# Patient Record
Sex: Male | Born: 1975 | Race: Black or African American | Hispanic: No | Marital: Single | Smoking: Current every day smoker
Health system: Southern US, Community
[De-identification: ages and names within clinical notes are randomized; demographics above are authoritative.]

## PROBLEM LIST (undated history)

## (undated) DIAGNOSIS — A150 Tuberculosis of lung: Secondary | ICD-10-CM

---

## 2005-04-11 ENCOUNTER — Inpatient Hospital Stay (HOSPITAL_COMMUNITY): Admission: EM | Admit: 2005-04-11 | Discharge: 2005-04-11 | Payer: Self-pay | Admitting: Emergency Medicine

## 2005-04-26 ENCOUNTER — Emergency Department (HOSPITAL_COMMUNITY): Admission: EM | Admit: 2005-04-26 | Discharge: 2005-04-26 | Payer: Self-pay | Admitting: Emergency Medicine

## 2006-06-23 ENCOUNTER — Emergency Department (HOSPITAL_COMMUNITY): Admission: EM | Admit: 2006-06-23 | Discharge: 2006-06-23 | Payer: Self-pay | Admitting: Emergency Medicine

## 2006-06-25 ENCOUNTER — Emergency Department (HOSPITAL_COMMUNITY): Admission: EM | Admit: 2006-06-25 | Discharge: 2006-06-25 | Payer: Self-pay | Admitting: Emergency Medicine

## 2007-07-15 ENCOUNTER — Emergency Department (HOSPITAL_COMMUNITY): Admission: EM | Admit: 2007-07-15 | Discharge: 2007-07-15 | Payer: Self-pay | Admitting: Emergency Medicine

## 2007-07-21 ENCOUNTER — Emergency Department (HOSPITAL_COMMUNITY): Admission: EM | Admit: 2007-07-21 | Discharge: 2007-07-21 | Payer: Self-pay | Admitting: Emergency Medicine

## 2008-08-29 ENCOUNTER — Emergency Department (HOSPITAL_COMMUNITY): Admission: EM | Admit: 2008-08-29 | Discharge: 2008-08-29 | Payer: Self-pay | Admitting: Emergency Medicine

## 2008-12-26 ENCOUNTER — Ambulatory Visit: Payer: Self-pay | Admitting: Internal Medicine

## 2008-12-26 ENCOUNTER — Inpatient Hospital Stay (HOSPITAL_COMMUNITY): Admission: EM | Admit: 2008-12-26 | Discharge: 2008-12-30 | Payer: Self-pay | Admitting: Emergency Medicine

## 2008-12-27 ENCOUNTER — Ambulatory Visit: Payer: Self-pay | Admitting: Infectious Disease

## 2008-12-28 ENCOUNTER — Encounter: Payer: Self-pay | Admitting: Infectious Diseases

## 2009-01-28 ENCOUNTER — Encounter: Admission: RE | Admit: 2009-01-28 | Discharge: 2009-01-28 | Payer: Self-pay | Admitting: Infectious Diseases

## 2009-10-27 ENCOUNTER — Encounter: Admission: RE | Admit: 2009-10-27 | Discharge: 2009-10-27 | Payer: Self-pay | Admitting: Infectious Diseases

## 2010-05-14 ENCOUNTER — Encounter: Payer: Self-pay | Admitting: Infectious Diseases

## 2010-07-28 LAB — CULTURE, RESPIRATORY W GRAM STAIN: Culture: NORMAL

## 2010-07-28 LAB — PNEUMOCYSTIS JIROVECI SMEAR BY DFA: Pneumocystis jiroveci Ag: NOT DETECTED

## 2010-07-28 LAB — URINALYSIS, MICROSCOPIC ONLY
Bilirubin Urine: NEGATIVE
Glucose, UA: NEGATIVE mg/dL
Hgb urine dipstick: NEGATIVE
Ketones, ur: NEGATIVE mg/dL
Protein, ur: NEGATIVE mg/dL
Urobilinogen, UA: 2 mg/dL — ABNORMAL HIGH (ref 0.0–1.0)

## 2010-07-28 LAB — AFB CULTURE WITH SMEAR (NOT AT ARMC)

## 2010-07-28 LAB — POCT I-STAT, CHEM 8
Creatinine, Ser: 0.9 mg/dL (ref 0.4–1.5)
Glucose, Bld: 82 mg/dL (ref 70–99)
HCT: 42 % (ref 39.0–52.0)
Hemoglobin: 14.3 g/dL (ref 13.0–17.0)
Sodium: 139 mEq/L (ref 135–145)
TCO2: 29 mmol/L (ref 0–100)

## 2010-07-28 LAB — BASIC METABOLIC PANEL
CO2: 26 mEq/L (ref 19–32)
CO2: 27 mEq/L (ref 19–32)
Calcium: 8.9 mg/dL (ref 8.4–10.5)
Chloride: 102 mEq/L (ref 96–112)
GFR calc Af Amer: >60 mL/min (ref >60)
GFR calc Af Amer: >60 mL/min (ref >60)
Glucose, Bld: 89 mg/dL (ref 70–99)
Potassium: 3.8 mEq/L (ref 3.5–5.1)
Potassium: 3.9 mEq/L (ref 3.5–5.1)
Sodium: 139 mEq/L (ref 135–145)

## 2010-07-28 LAB — M. TUBERCULOSIS COMPLEX BY PCR
M. tuberculosis, Direct: DETECTED
M. tuberculosis, Direct: DETECTED

## 2010-07-28 LAB — CBC
HCT: 34.5 % — ABNORMAL LOW (ref 39.0–52.0)
HCT: 35.2 % — ABNORMAL LOW (ref 39.0–52.0)
HCT: 38.6 % — ABNORMAL LOW (ref 39.0–52.0)
HCT: 39.2 % (ref 39.0–52.0)
Hemoglobin: 11.5 g/dL — ABNORMAL LOW (ref 13.0–17.0)
Hemoglobin: 12.9 g/dL — ABNORMAL LOW (ref 13.0–17.0)
MCHC: 32.5 g/dL (ref 30.0–36.0)
MCHC: 32.8 g/dL (ref 30.0–36.0)
MCHC: 33 g/dL (ref 30.0–36.0)
MCHC: 33.3 g/dL (ref 30.0–36.0)
MCV: 77.3 fL — ABNORMAL LOW (ref 78.0–100.0)
MCV: 78 fL (ref 78.0–100.0)
MCV: 78.1 fL (ref 78.0–100.0)
Platelets: 560 10*3/uL — ABNORMAL HIGH (ref 150–400)
RBC: 4.45 MIL/uL (ref 4.22–5.81)
RBC: 4.51 MIL/uL (ref 4.22–5.81)
RBC: 5.03 MIL/uL (ref 4.22–5.81)
RDW: 14.6 % (ref 11.5–15.5)
WBC: 10 10*3/uL (ref 4.0–10.5)
WBC: 9.4 10*3/uL (ref 4.0–10.5)

## 2010-07-28 LAB — COMPREHENSIVE METABOLIC PANEL
ALT: 15 U/L (ref 0–53)
BUN: 6 mg/dL (ref 6–23)
CO2: 28 mEq/L (ref 19–32)
Calcium: 8.9 mg/dL (ref 8.4–10.5)
Creatinine, Ser: 0.97 mg/dL (ref 0.4–1.5)
GFR calc non Af Amer: >60 mL/min (ref >60)
Glucose, Bld: 73 mg/dL (ref 70–99)

## 2010-07-28 LAB — PHOSPHORUS: Phosphorus: 3.5 mg/dL (ref 2.3–4.6)

## 2010-07-28 LAB — FUNGUS CULTURE W SMEAR: Fungal Smear: NONE SEEN

## 2010-07-28 LAB — RETICULOCYTES: Retic Count, Absolute: 45.5 10*3/uL (ref 19.0–186.0)

## 2010-07-28 LAB — CULTURE, BLOOD (ROUTINE X 2): Culture: NO GROWTH

## 2010-07-28 LAB — EXPECTORATED SPUTUM ASSESSMENT W GRAM STAIN, RFLX TO RESP C

## 2010-07-28 LAB — FOLATE: Folate: 6 ng/mL

## 2010-07-28 LAB — DIFFERENTIAL
Basophils Relative: 1 % (ref 0–1)
Eosinophils Absolute: 0.2 10*3/uL (ref 0.0–0.7)
Eosinophils Relative: 2 % (ref 0–5)
Lymphs Abs: 1.5 10*3/uL (ref 0.7–4.0)
Monocytes Relative: 7 % (ref 3–12)

## 2010-07-28 LAB — IRON AND TIBC
Iron: 26 ug/dL — ABNORMAL LOW (ref 42–135)
TIBC: 191 ug/dL — ABNORMAL LOW (ref 215–435)

## 2010-07-28 LAB — C-REACTIVE PROTEIN: CRP: 5.3 mg/dL — ABNORMAL HIGH (ref <0.6)

## 2010-07-28 LAB — HIV-1 RNA QUANT-NO REFLEX-BLD
HIV 1 RNA Quant: <48 copies/mL (ref <48)
HIV-1 RNA Quant, Log: <1.68 {Log} (ref <1.68)

## 2010-07-28 LAB — HIV ANTIBODY (ROUTINE TESTING W REFLEX): HIV: NONREACTIVE

## 2010-07-28 LAB — FERRITIN: Ferritin: 278 ng/mL (ref 22–322)

## 2010-07-28 LAB — MAGNESIUM: Magnesium: 1.9 mg/dL (ref 1.5–2.5)
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
## 2011-11-26 ENCOUNTER — Emergency Department (HOSPITAL_COMMUNITY)
Admission: EM | Admit: 2011-11-26 | Discharge: 2011-11-26 | Discharge status: Against Medical Advice | Payer: Self-pay | Attending: Emergency Medicine | Admitting: Emergency Medicine

## 2011-11-26 ENCOUNTER — Encounter (HOSPITAL_COMMUNITY): Payer: Self-pay | Admitting: *Deleted

## 2011-11-26 DIAGNOSIS — Z0389 Encounter for observation for other suspected diseases and conditions ruled out: Secondary | ICD-10-CM | POA: Insufficient documentation

## 2011-11-26 NOTE — ED Notes (Signed)
Pt states last Sunday was having intercourse and felt something pop out of line in the right groin and feels knot.

## 2015-07-19 ENCOUNTER — Emergency Department (HOSPITAL_COMMUNITY)
Admission: EM | Admit: 2015-07-19 | Discharge: 2015-07-19 | Disposition: A | Discharge status: Home / Self Care | Payer: Self-pay | Attending: Emergency Medicine | Admitting: Emergency Medicine

## 2015-07-19 ENCOUNTER — Emergency Department (HOSPITAL_COMMUNITY): Payer: Self-pay

## 2015-07-19 ENCOUNTER — Encounter (HOSPITAL_COMMUNITY): Payer: Self-pay | Admitting: Emergency Medicine

## 2015-07-19 DIAGNOSIS — R079 Chest pain, unspecified: Secondary | ICD-10-CM | POA: Insufficient documentation

## 2015-07-19 DIAGNOSIS — J069 Acute upper respiratory infection, unspecified: Secondary | ICD-10-CM | POA: Insufficient documentation

## 2015-07-19 DIAGNOSIS — R110 Nausea: Secondary | ICD-10-CM | POA: Insufficient documentation

## 2015-07-19 DIAGNOSIS — F17200 Nicotine dependence, unspecified, uncomplicated: Secondary | ICD-10-CM | POA: Insufficient documentation

## 2015-07-19 MED ORDER — BENZONATATE 100 MG PO CAPS: 100.0000 mg | ORAL_CAPSULE | Freq: Three times a day (TID) | ORAL | Status: DC

## 2015-07-19 MED ORDER — BENZONATATE 100 MG PO CAPS
100.0000 mg | ORAL_CAPSULE | Freq: Once | ORAL | Status: AC
Start: 2015-07-19 — End: 2015-07-19
  Administered 2015-07-19: 100 mg via ORAL
  Filled 2015-07-19: qty 1

## 2015-07-19 NOTE — ED Notes (Signed)
Pt c/o cough and chest congestion. No pain with resting. Pt states it hurts to cough. Pt c/o drainage in his throat. Pt in NAD at this time, resp e/u. Pt taking tussin without relief.

## 2015-07-19 NOTE — ED Notes (Signed)
Pt to xray at this time.

## 2015-07-19 NOTE — ED Notes (Signed)
Pt st's he has had a cough and congestion in his chest for several days

## 2015-07-19 NOTE — Discharge Instructions (Signed)
1. Medications: tessalon for cough, usual home medications 2. Treatment: rest, drink plenty of fluids; try warm honey, tea, throat lozenges for additional symptom relief 3. Follow Up: please followup with your primary doctor for discussion of your diagnoses and further evaluation after today's visit; if you do not have a primary care doctor use the phone number listed in your discharge paperwork to find one; please return to the ER for high fever, shortness of breath, new or worsening symptoms   Upper Respiratory Infection, Adult Most upper respiratory infections (URIs) are caused by a virus. A URI affects the nose, throat, and upper air passages. The most common type of URI is often called "the common cold." HOME CARE   Take medicines only as told by your doctor.  Gargle warm saltwater or take cough drops to comfort your throat as told by your doctor.  Use a warm mist humidifier or inhale steam from a shower to increase air moisture. This may make it easier to breathe.  Drink enough fluid to keep your pee (urine) clear or pale yellow.  Eat soups and other clear broths.  Have a healthy diet.  Rest as needed.  Go back to work when your fever is gone or your doctor says it is okay.  You may need to stay home longer to avoid giving your URI to others.  You can also wear a face mask and wash your hands often to prevent spread of the virus.  Use your inhaler more if you have asthma.  Do not use any tobacco products, including cigarettes, chewing tobacco, or electronic cigarettes. If you need help quitting, ask your doctor. GET HELP IF:  You are getting worse, not better.  Your symptoms are not helped by medicine.  You have chills.  You are getting more short of breath.  You have brown or red mucus.  You have yellow or brown discharge from your nose.  You have pain in your face, especially when you bend forward.  You have a fever.  You have puffy (swollen) neck  glands.  You have pain while swallowing.  You have white areas in the back of your throat. GET HELP RIGHT AWAY IF:   You have very bad or constant:  Headache.  Ear pain.  Pain in your forehead, behind your eyes, and over your cheekbones (sinus pain).  Chest pain.  You have long-lasting (chronic) lung disease and any of the following:  Wheezing.  Long-lasting cough.  Coughing up blood.  A change in your usual mucus.  You have a stiff neck.  You have changes in your:  Vision.  Hearing.  Thinking.  Mood. MAKE SURE YOU:   Understand these instructions.  Will watch your condition.  Will get help right away if you are not doing well or get worse.   This information is not intended to replace advice given to you by your health care provider. Make sure you discuss any questions you have with your health care provider.   Document Released: 09/26/2007 Document Revised: 08/24/2014 Document Reviewed: 07/15/2013 Elsevier Interactive Patient Education [employer]2016 Elsevier Inc.

## 2015-07-19 NOTE — ED Provider Notes (Signed)
CSN: [id number]     Arrival date & time 07/19/15  1557 History   By signing my name below, I, Iona Beard, attest that this documentation has been prepared under the direction and in the presence of Mady Gemma., PA-C.  Electronically Signed: Iona Beard, ED Scribe 07/19/2015 at 5:21 PM.   Chief Complaint  Patient presents with  . Cough    The history is provided by the patient. No language interpreter was used.    HPI Comments: Steven Jacobs is a 40 y.o. male who presents to the Emergency Department complaining of gradual onset, productive cough, ongoing for four days. He reports associated chest congestion, fatigue, nasal congestion, mild nausea, and chest pain with coughing. He has taken tussin at home with minimal relief to symptoms. No other worsening or alleviating factors noted. Pt denies abdominal pain, vomiting, fever, chills, sore throat, ear pain, or any other pertinent symptoms.   History reviewed. No pertinent past medical history. History reviewed. No pertinent past surgical history. No family history on file. Social History  Substance Use Topics  . Smoking status: Current Every Day Smoker  . Smokeless tobacco: None  . Alcohol Use: Yes     Comment: occ      Review of Systems  Constitutional: Positive for fatigue. Negative for fever and chills.  HENT: Positive for congestion. Negative for ear pain and sore throat.   Respiratory: Positive for cough.   Cardiovascular: Positive for chest pain.       Chest pain with coughing.  Gastrointestinal: Positive for nausea. Negative for vomiting and abdominal pain.    Allergies  Review of patient's allergies indicates no known allergies.  Home Medications   Prior to Admission medications   Medication Sig Start Date End Date Taking? Authorizing Provider  benzonatate (TESSALON) 100 MG capsule Take 1 capsule (100 mg total) by mouth every 8 (eight) hours. 07/19/15   Dorise Hiss Westfall, PA-C    BP 120/77  mmHg  Pulse 99  Temp(Src) 99 F (37.2 C)  Resp 18  SpO2 97% Physical Exam  Constitutional: He is oriented to person, place, and time. He appears well-developed and well-nourished. No distress.  HENT:  Head: Normocephalic and atraumatic.  Right Ear: Tympanic membrane, external ear and ear canal normal.  Left Ear: Tympanic membrane, external ear and ear canal normal.  Nose: Nose normal.  Mouth/Throat: Uvula is midline, oropharynx is clear and moist and mucous membranes are normal. No oropharyngeal exudate.  Eyes: Conjunctivae, EOM and lids are normal. Pupils are equal, round, and reactive to light. Right eye exhibits no discharge. Left eye exhibits no discharge. No scleral icterus.  Neck: Normal range of motion. Neck supple.  Cardiovascular: Normal rate, regular rhythm, normal heart sounds, intact distal pulses and normal pulses.   Pulmonary/Chest: Effort normal and breath sounds normal. No respiratory distress. He has no wheezes. He has no rales.  Abdominal: Soft. Normal appearance and bowel sounds are normal. He exhibits no distension and no mass. There is no tenderness. There is no rigidity, no rebound and no guarding.  Musculoskeletal: Normal range of motion. He exhibits no edema or tenderness.  Neurological: He is alert and oriented to person, place, and time.  Skin: Skin is warm, dry and intact. No rash noted. He is not diaphoretic. No erythema. No pallor.  Psychiatric: He has a normal mood and affect. His speech is normal and behavior is normal.  Nursing note and vitals reviewed.   ED Course  Procedures (including critical care  time)  DIAGNOSTIC STUDIES: Oxygen Saturation is 97% on RA, normal by my interpretation.    COORDINATION OF CARE: 4:21 PM-Discussed treatment plan which includes CXR with pt at bedside and pt agreed to plan.   Labs Review Labs Reviewed - No data to display  Imaging Review Dg Chest 2 View  07/19/2015  CLINICAL DATA:  Cough for 4 days, smoker EXAM:  CHEST  2 VIEW COMPARISON:  10/27/2009 FINDINGS: Cardiomediastinal silhouette is stable. No infiltrate or pulmonary edema. Stable scarring in left upper lobe. Bony thorax is unremarkable. IMPRESSION: No active disease.  Stable scarring in left upper lobe. Electronically Signed   By: Natasha MeadLiviu  Pop M.D.   On: 07/19/2015 17:09   I have personally reviewed and evaluated these images as part of my medical decision-making.   EKG Interpretation None      MDM   Final diagnoses:  URI (upper respiratory infection)    40 year old male presents with productive cough since Wednesday. Notes nasal congestion and chest congestion as well as chest pain only with coughing. Patient is afebrile. Vital signs stable. TMs clear bilaterally. No edema, erythema, or exudate to posterior oropharynx. Lungs clear to auscultation bilaterally. No increased work of breathing.   Will obtain CXR and give tessalon. Imaging negative for active disease, reveals stable scarring in left upper lobe.   Discussed findings with patient, who is non-toxic and well-appearing. Feel he is stable for discharge at this time. Will give cough medicine for home. Patient to follow-up with PCP. Return precautions discussed. Patient verbalizes his understanding and is in agreement with plan.  BP 120/77 mmHg  Pulse 99  Temp(Src) 99 F (37.2 C)  Resp 18  SpO2 97%      Mady Gemmalizabeth C Westfall, PA-C 07/19/15 1723  Gerhard Munchobert Lockwood, MD 07/19/15 1931

## 2016-04-02 ENCOUNTER — Emergency Department (HOSPITAL_COMMUNITY)
Admission: EM | Admit: 2016-04-02 | Discharge: 2016-04-02 | Disposition: A | Discharge status: Home / Self Care | Payer: Self-pay | Attending: Emergency Medicine | Admitting: Emergency Medicine

## 2016-04-02 ENCOUNTER — Encounter (HOSPITAL_COMMUNITY): Payer: Self-pay

## 2016-04-02 ENCOUNTER — Emergency Department (HOSPITAL_COMMUNITY): Payer: Self-pay

## 2016-04-02 DIAGNOSIS — Z87891 Personal history of nicotine dependence: Secondary | ICD-10-CM | POA: Insufficient documentation

## 2016-04-02 DIAGNOSIS — R21 Rash and other nonspecific skin eruption: Secondary | ICD-10-CM | POA: Insufficient documentation

## 2016-04-02 DIAGNOSIS — R05 Cough: Secondary | ICD-10-CM | POA: Insufficient documentation

## 2016-04-02 DIAGNOSIS — R059 Cough, unspecified: Secondary | ICD-10-CM

## 2016-04-02 HISTORY — DX: Tuberculosis of lung: A15.0

## 2016-04-02 MED ORDER — IPRATROPIUM-ALBUTEROL 0.5-2.5 (3) MG/3ML IN SOLN
3.0000 mL | Freq: Once | RESPIRATORY_TRACT | Status: AC
  Administered 2016-04-02: 3 mL via RESPIRATORY_TRACT
  Filled 2016-04-02: qty 3

## 2016-04-02 MED ORDER — AZITHROMYCIN 250 MG PO TABS: 250.0000 mg | ORAL_TABLET | Freq: Every day | ORAL | 0 refills | Status: DC

## 2016-04-02 MED ORDER — BENZONATATE 100 MG PO CAPS: 100.0000 mg | ORAL_CAPSULE | Freq: Three times a day (TID) | ORAL | 0 refills | Status: DC

## 2016-04-02 NOTE — ED Triage Notes (Signed)
Per Pt, Pt is coming from home with complaints of continued Cough and sputum. Pt has been taken over the counter medications with no relief. Pt denies fevers and chills at this time. Pt noted a rash from the medication on his left clavicle.

## 2016-04-02 NOTE — Discharge Instructions (Signed)
Take antibiotic for 5 days. Take with food Take cough medicine as needed Use hydrocortisone 1% over the counter for rash and itching. You can also take Benadryl for itching which is over the counter Drink plenty of fluids Return if symptoms are worsening Avoid smoking as much as possible

## 2016-04-02 NOTE — ED Provider Notes (Signed)
[facility]-EMERGENCY DEPT Provider Note   CSN: [id number] Arrival date & time: 04/02/16  1101  By signing my name below, I, Gasper Sellsobert Ryan MilfordHalas, attest that this documentation has been prepared under the direction and in the presence of Terance HartKelly Jose Corvin, PA-C. Electronically Signed: Javier Dockerobert Ryan Halas, ER Scribe. 12/03/2015. 1:07 PM.  History   Chief Complaint Chief Complaint  Patient presents with  . Rash  . Cough   HPI HPI Comments: Steven Jacobs is a 40 y.o. male who presents to the Emergency Department complaining of 12 days of productive cough. He is taking an OTC antitussive cough syrup. He endorses mild rhinorrhea. He had fever, chills and diaphoresis at onset but those sx have since resolved. He denies sore throat. 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 He is a regular every day smoker, 1 PPD.   He is also complaining of two days of mild rash on his upper chest and neck. It is mildly itchy. He has been using a new detergent. He denies trouble breathing.   Past Medical History:  Diagnosis Date  . TB (pulmonary tuberculosis)     There are no active problems to display for this patient.   History reviewed. No pertinent surgical history.     Home Medications    Prior to Admission medications   Medication Sig Start Date End Date Taking? Authorizing Provider  benzonatate (TESSALON) 100 MG capsule Take 1 capsule (100 mg total) by mouth every 8 (eight) hours. 07/19/15   Mady GemmaElizabeth C Westfall, PA-C    Family History No family history on file.  Social History Social History  Substance Use Topics  . Smoking status: Former Games developermoker  . Smokeless tobacco: Never Used  . Alcohol use Yes     Comment: occ     Allergies   Patient has no known allergies.   Review of Systems Review of Systems  Constitutional: Negative for chills, diaphoresis and fever.  HENT: Positive for congestion and rhinorrhea. Negative for ear pain, postnasal drip, sinus pain  and sore throat.   Respiratory: Positive for cough. Negative for shortness of breath and wheezing.   Cardiovascular: Negative for chest pain.  Skin: Positive for rash.  All other systems reviewed and are negative.    Physical Exam Updated Vital Signs BP 127/83 (BP Location: Left Arm)   Pulse 81   Temp 97.8 F (36.6 C) (Oral)   Resp 18   Ht 5\' 11"  (1.803 m)   Wt 167 lb (75.8 kg)   SpO2 99%   BMI 23.29 kg/m   Physical Exam  Constitutional: He is oriented to person, place, and time. He appears well-developed and well-nourished. No distress.  HENT:  Head: Normocephalic and atraumatic.  Right Ear: Hearing, tympanic membrane, external ear and ear canal normal.  Left Ear: Hearing, tympanic membrane, external ear and ear canal normal.  Nose: Mucosal edema present. No rhinorrhea.  Mouth/Throat: Uvula is midline, oropharynx is clear and moist and mucous membranes are normal.  Eyes: Pupils are equal, round, and reactive to light.  Neck: Neck supple.  Cardiovascular: Normal rate and regular rhythm.  Exam reveals no gallop and no friction rub.   No murmur heard. Pulmonary/Chest: Effort normal. No respiratory distress. He has wheezes. He has no rales. He exhibits no tenderness.  Mild expiratory wheezes  Musculoskeletal: Normal range of motion.  Neurological: He is alert and oriented to person, place, and time. Coordination normal.  Skin: Skin is warm and dry. He is  not diaphoretic.  Psychiatric: He has a normal mood and affect. His behavior is normal.  Nursing note and vitals reviewed.    ED Treatments / Results  Labs (all labs ordered are listed, but only abnormal results are displayed) Labs Reviewed - No data to display  EKG  EKG Interpretation None       Radiology Dg Chest 2 View  Result Date: 04/02/2016 CLINICAL DATA:  Cough for 2 weeks. EXAM: CHEST  2 VIEW COMPARISON:  07/19/2015 FINDINGS: Stable left upper lobe scarring. Lungs otherwise clear. No pleural effusion.  No pneumothorax. Cardiac silhouette is normal in size. No mediastinal or hilar masses. No evidence of adenopathy. Skeletal structures are intact. IMPRESSION: No active cardiopulmonary disease. Electronically Signed   By: Amie Portlandavid  Ormond M.D.   On: 04/02/2016 12:49     Procedures Procedures (including critical care time)  Medications Ordered in ED Medications  ipratropium-albuterol (DUONEB) 0.5-2.5 (3) MG/3ML nebulizer solution 3 mL (3 mLs Nebulization Given 04/02/16 1321)     Initial Impression / Assessment and Plan / ED Course  I have reviewed the triage vital signs and the nursing notes.  Pertinent labs & imaging results that were available during my care of the patient were reviewed by me and considered in my medical decision making (see chart for details).  Clinical Course    40 year old male presents with productive cough and wheezing. Patient is afebrile, not tachycardic or tachypneic, normotensive, and not hypoxic. CXR remarkable for old upper lobe scarring. Lung exam remarkable for wheezing - Duoneb ordered. Will d/c with course of Azithromycin to cover for CAP. Patient is NAD, non-toxic, with stable VS. Patient is informed of clinical course, understands medical decision making process, and agrees with plan. Opportunity for questions provided and all questions answered. Return precautions given.   Final Clinical Impressions(s) / ED Diagnoses   Final diagnoses:  Cough  Rash    New Prescriptions Discharge Medication List as of 04/02/2016  1:42 PM    START taking these medications   Details  azithromycin (ZITHROMAX) 250 MG tablet Take 1 tablet (250 mg total) by mouth daily. Take first 2 tablets together, then 1 every day until finished., Starting Mon 04/02/2016, Print        I personally performed the services described in this documentation, which was scribed in my presence. The recorded information has been reviewed and is accurate.        Bethel BornKelly Marie Dayon Witt,  PA-C 04/07/16 [phone]    Loren Raceravid Yelverton, MD 04/07/16 [phone]

## 2016-04-02 NOTE — ED Notes (Signed)
Declined W/C at D/C and was escorted to lobby by RN. 

## 2016-10-22 IMAGING — DX DG CHEST 2V
2 series · 2 of 2 positions shown · non-contrast
Comparison: 10/27/2009

CLINICAL DATA: Cough for 4 days, smoker

EXAM:
CHEST  2 VIEW

[chest pa]
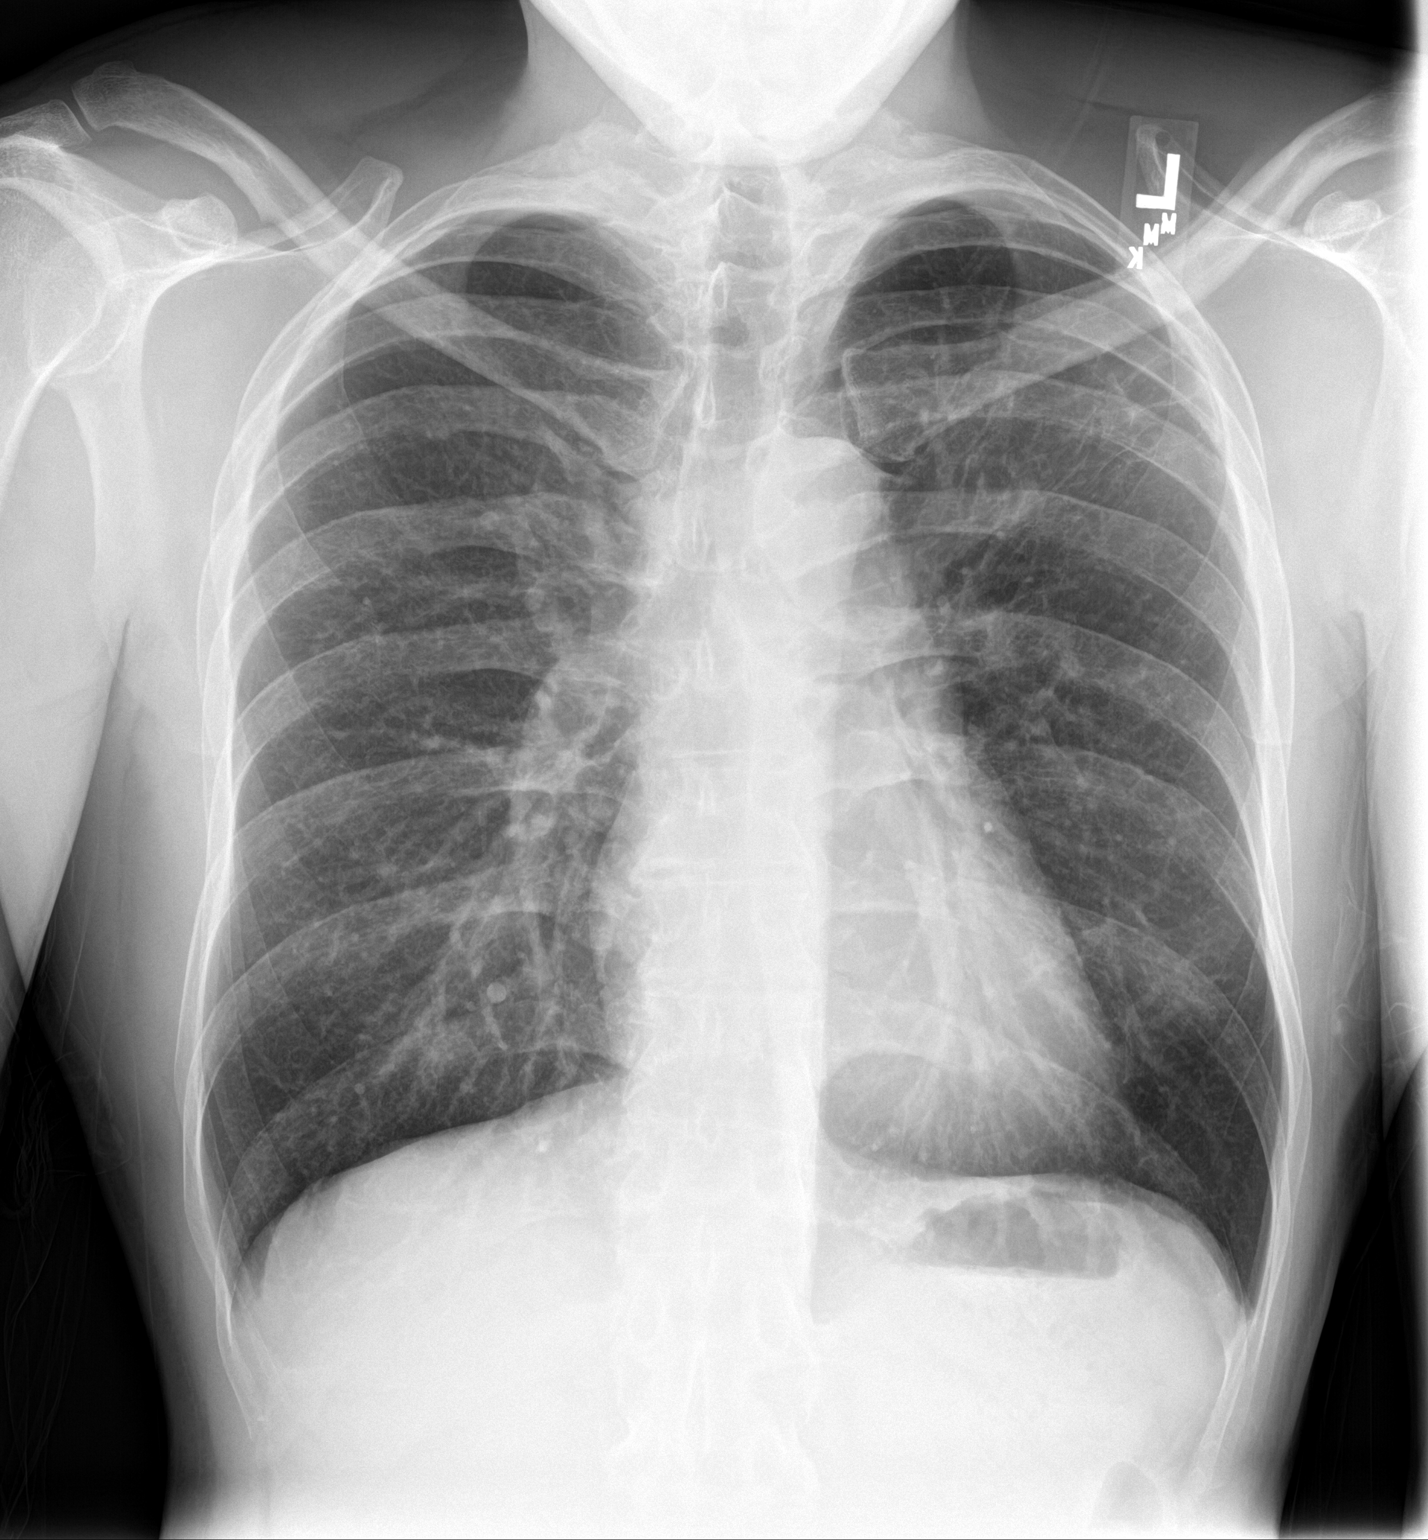

[chest lat]
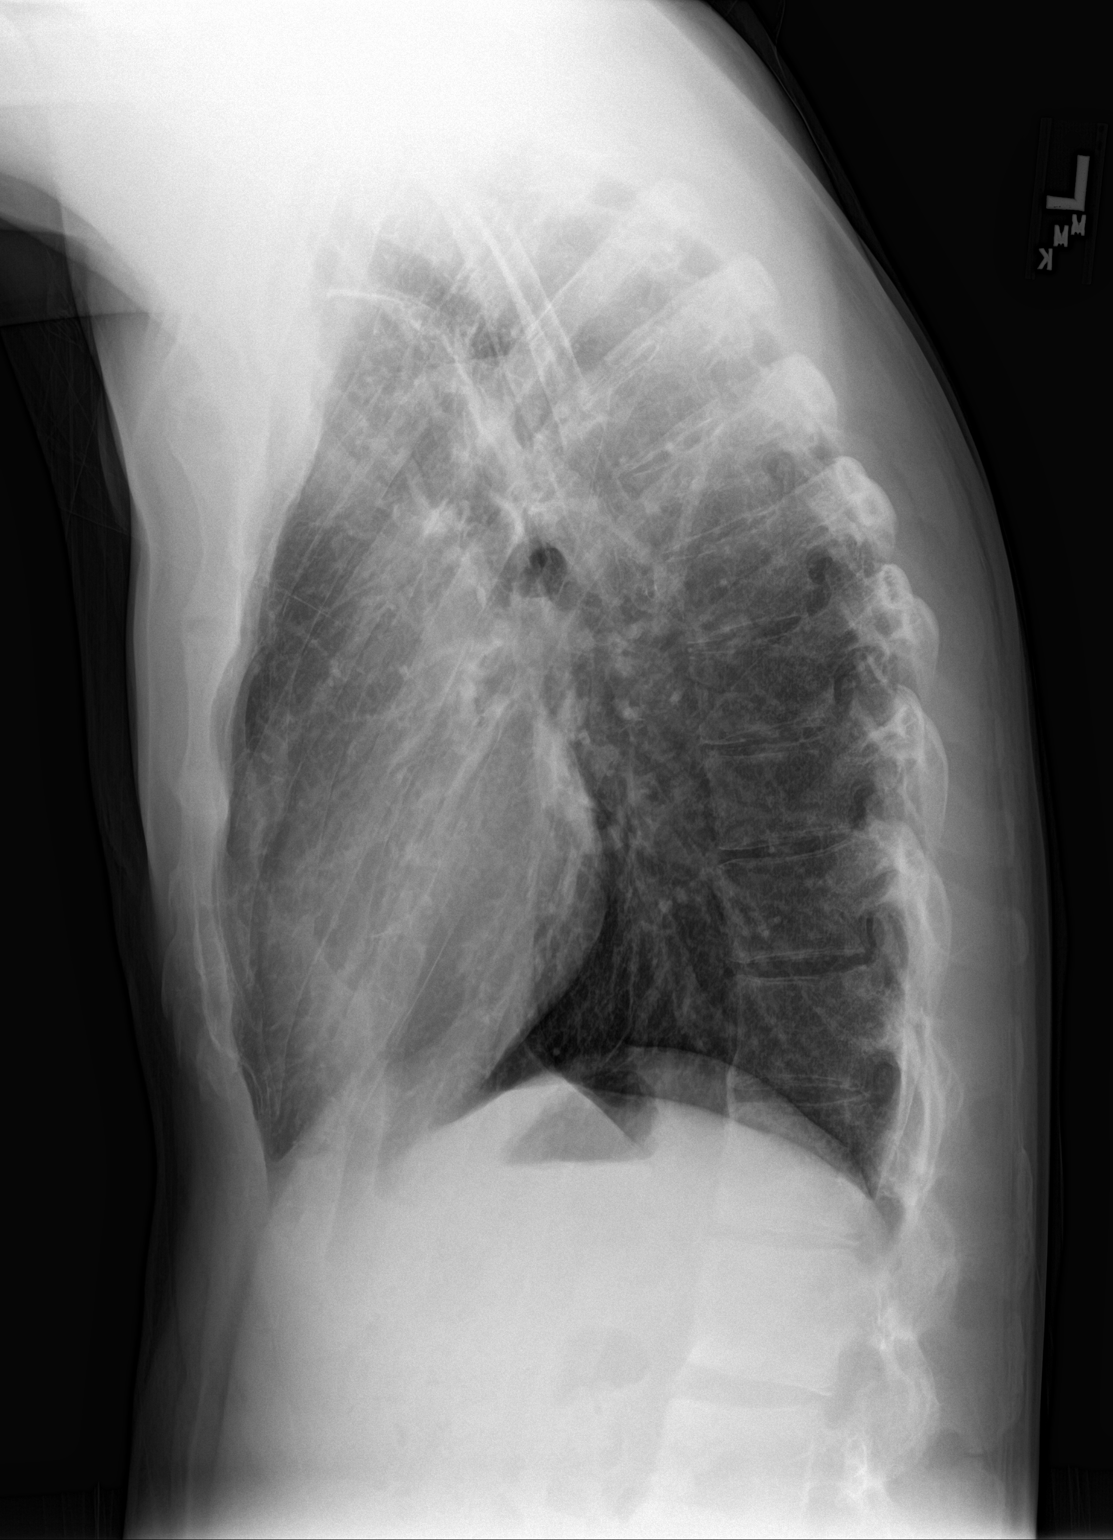

[2 of 2 positions shown; findings below may reference images not displayed]

FINDINGS: Cardiomediastinal silhouette is stable. No infiltrate or pulmonary
edema. Stable scarring in left upper lobe. Bony thorax is
unremarkable.
IMPRESSION: No active disease.  Stable scarring in left upper lobe.

## 2018-01-28 ENCOUNTER — Encounter (HOSPITAL_COMMUNITY): Payer: Self-pay

## 2018-01-28 ENCOUNTER — Other Ambulatory Visit: Payer: Self-pay

## 2018-01-28 ENCOUNTER — Emergency Department (HOSPITAL_COMMUNITY)
Admission: EM | Admit: 2018-01-28 | Discharge: 2018-01-28 | Disposition: A | Discharge status: Home / Self Care | Payer: Self-pay | Attending: Emergency Medicine | Admitting: Emergency Medicine

## 2018-01-28 DIAGNOSIS — K047 Periapical abscess without sinus: Secondary | ICD-10-CM | POA: Insufficient documentation

## 2018-01-28 DIAGNOSIS — F17200 Nicotine dependence, unspecified, uncomplicated: Secondary | ICD-10-CM | POA: Insufficient documentation

## 2018-01-28 DIAGNOSIS — K0889 Other specified disorders of teeth and supporting structures: Secondary | ICD-10-CM

## 2018-01-28 MED ORDER — BUTAMBEN-TETRACAINE-BENZOCAINE 2-2-14 % EX AERO
1.0000 | INHALATION_SPRAY | Freq: Once | CUTANEOUS | Status: AC
  Administered 2018-01-28: 1 via TOPICAL
  Filled 2018-01-28: qty 20

## 2018-01-28 MED ORDER — IBUPROFEN 600 MG PO TABS: 600.0000 mg | ORAL_TABLET | Freq: Four times a day (QID) | ORAL | 0 refills | Status: DC | PRN

## 2018-01-28 MED ORDER — CLINDAMYCIN HCL 150 MG PO CAPS: 300.0000 mg | ORAL_CAPSULE | Freq: Three times a day (TID) | ORAL | 0 refills | Status: AC

## 2018-01-28 MED ORDER — BUPIVACAINE-EPINEPHRINE (PF) 0.5% -1:200000 IJ SOLN
1.8000 mL | Freq: Once | INTRAMUSCULAR | Status: AC
  Administered 2018-01-28: 1.8 mL
  Filled 2018-01-28: qty 1.8

## 2018-01-28 MED ORDER — LIDOCAINE VISCOUS HCL 2 % MT SOLN: 15.0000 mL | OROMUCOSAL | 0 refills | Status: DC | PRN

## 2018-01-28 NOTE — ED Triage Notes (Signed)
Pt c/o dental pain and oral swelling on left side X2 days.

## 2018-01-28 NOTE — ED Notes (Signed)
Pt stable, ambulatory, and verbalizes understanding of d/c instructions. Security returned pt's knife from security office.

## 2018-01-28 NOTE — ED Provider Notes (Signed)
[facility] EMERGENCY DEPARTMENT Provider Note   CSN: [id number] Arrival date & time: 01/28/18  0740     History   Chief Complaint Chief Complaint  Patient presents with  . Dental Pain    HPI Steven Jacobs is a 42 y.o. male who presents emergency department today for dental pain x2 days.  Patient reports that on Sunday he began noticing pain in his left lower molars.  He reports the pain is throbbing in nature and rates his moderate in severity.  He notes nothing makes his symptoms better or worse.  He has not tried anything for symptoms.  He does not have a dentist. Denies fever, chills, voice change, inability to control secretions, nausea/vomiting, facial swelling, neck swelling, dysphagia, odynophagia, drainage or trauma  HPI  Past Medical History:  Diagnosis Date  . TB (pulmonary tuberculosis)     There are no active problems to display for this patient.   History reviewed. No pertinent surgical history.      Home Medications    Prior to Admission medications   Medication Sig Start Date End Date Taking? Authorizing Provider  azithromycin (ZITHROMAX) 250 MG tablet Take 1 tablet (250 mg total) by mouth daily. Take first 2 tablets together, then 1 every day until finished. 04/02/16   Bethel Born, PA-C  benzonatate (TESSALON) 100 MG capsule Take 1 capsule (100 mg total) by mouth every 8 (eight) hours. 04/02/16   Bethel Born, PA-C    Family History No family history on file.  Social History Social History   Tobacco Use  . Smoking status: Current Every Day Smoker    Packs/day: 1.00  . Smokeless tobacco: Never Used  Substance Use Topics  . Alcohol use: Yes    Comment: occ  . Drug use: Yes    Types: Marijuana     Allergies   Patient has no known allergies.   Review of Systems Review of Systems  Constitutional: Negative for chills and fever.  HENT: Positive for dental problem. Negative for drooling, ear pain, sinus pain,  sore throat, trouble swallowing and voice change.   Gastrointestinal: Negative for nausea and vomiting.  Musculoskeletal: Negative for neck stiffness.  Neurological: Negative for headaches.     Physical Exam Updated Vital Signs BP (!) 125/95 (BP Location: Right Arm)   Pulse 99   Temp 99.3 F (37.4 C) (Oral)   Resp 18   Ht 5\' 10"  (1.778 m)   Wt 71.2 kg   SpO2 98%   BMI 22.53 kg/m   Physical Exam  Constitutional: He appears well-developed and well-nourished.  HENT:  Head: Normocephalic and atraumatic.  Right Ear: External ear normal.  Left Ear: External ear normal.  Nose: Nose normal. Right sinus exhibits no maxillary sinus tenderness and no frontal sinus tenderness. Left sinus exhibits no maxillary sinus tenderness and no frontal sinus tenderness.  Mouth/Throat:    The patient has normal phonation and is in control of secretions. No stridor.  Midline uvula without edema. Soft palate rises symmetrically.  No tonsillar erythema or exudates. No PTA. Tongue protrusion is normal. No trismus. No creptius on neck palpation. No neck swelling. Patient has overall poor dentition. Pain  On palpation of teeth indicated in diagram. Patient has fluctuance along gumline of corresponding teeth. No floor edema.No drainage. Mucus membranes moist.   Eyes: Conjunctivae are normal. Right eye exhibits no discharge. Left eye exhibits no discharge. No scleral icterus.  Neck:  No nuchal rigidity or meningismus. No crepitus  on neck palpation.  No submental or submandibular edema  Pulmonary/Chest: Effort normal. No respiratory distress.  Lymphadenopathy:       Head (right side): No submental and no submandibular adenopathy present.       Head (left side): No submental and no submandibular adenopathy present.    He has no cervical adenopathy.  Neurological: He is alert.  Skin: No pallor.  Psychiatric: He has a normal mood and affect.  Nursing note and vitals reviewed.    ED Treatments / Results    Labs (all labs ordered are listed, but only abnormal results are displayed) Labs Reviewed - No data to display  EKG None  Radiology No results found.  Procedures .Marland KitchenIncision and Drainage Date/Time: 01/28/2018 9:14 AM Performed by: Jacinto Halim, PA-C Authorized by: Jacinto Halim, PA-C   Consent:    Consent obtained:  Verbal   Consent given by:  Patient   Risks discussed:  Bleeding, damage to other organs, infection, incomplete drainage and pain   Alternatives discussed:  No treatment Location:    Type:  Abscess (dental)   Size:  1cm   Location: dental. Anesthesia (see MAR for exact dosages):    Anesthesia method:  Topical application   Topical anesthetic:  Benzocaine gel (cetcan spray) Procedure type:    Complexity:  Simple Procedure details:    Needle aspiration: yes     Needle size:  18 G   Drainage:  Purulent and bloody   Drainage amount:  Moderate   Wound treatment:  Wound left open   Packing materials:  None Post-procedure details:    Patient tolerance of procedure:  Tolerated well, no immediate complications   (including critical care time)   Medications Ordered in ED Medications  butamben-tetracaine-benzocaine (CETACAINE) spray 1 spray (has no administration in time range)  bupivacaine-epinephrine (MARCAINE W/ EPI) 0.5% -1:200000 injection 1.8 mL (1.8 mLs Infiltration Given by Other 01/28/18 0804)     Initial Impression / Assessment and Plan / ED Course  I have reviewed the triage vital signs and the nursing notes.  Pertinent labs & imaging results that were available during my care of the patient were reviewed by me and considered in my medical decision making (see chart for details).     42 y.o. male with dental pain with gingival abscess that was drained in the department. Exam unconcerning for Ludwig's angina.  Will treat with clindamycin and anti-inflammatories medicine.  Urged patient to follow-up with dentist. Dental resources given.  Return precautions discussed.  Final Clinical Impressions(s) / ED Diagnoses   Final diagnoses:  Pain, dental    ED Discharge Orders         Ordered    clindamycin (CLEOCIN) 150 MG capsule  3 times daily     01/28/18 0914    ibuprofen (ADVIL,MOTRIN) 600 MG tablet  Every 6 hours PRN     01/28/18 0914    lidocaine (XYLOCAINE) 2 % solution  As needed     01/28/18 0914           Jacinto Halim, PA-C 01/28/18 0915    Eber Hong, MD 01/29/18 [phone]

## 2018-01-28 NOTE — ED Notes (Signed)
Dental box placed at the bedside

## 2018-01-28 NOTE — ED Provider Notes (Signed)
Medical screening examination/treatment/procedure(s) were conducted as a shared visit with non-physician practitioner(s) and myself.  I personally evaluated the patient during the encounter.  Clinical Impression:   Final diagnoses:  Pain, dental   42 year old male with progressive swelling of the left lower jaw over the last several days, generally poor dentition, on exam he does have some tenderness to the left lower gingiva with swelling but no trismus torticollis or lymphadenopathy of the neck of any significant degree.  No change in phonation.  Likely has early phlegmon or abscess, antibiotics indicated, attempt needle drainage, patient otherwise stable for discharge, does not have any signs of lipids.   Eber Hong, MD 01/29/18 [phone]

## 2018-01-28 NOTE — Discharge Instructions (Signed)
Please read and follow all provided instructions.  Your diagnoses today include:  1. Pain, dental     The exam and treatment you received today has been provided on an emergency basis only. This is not a substitute for complete medical or dental care. This problem will not resolve on its own without the care of a dentist.  Tests performed today include: Vital signs. See below for your results today.   Medications prescribed:   Take any prescribed medications only as directed. Use ibuprofen or naproxen for pain. Use the viscous lidocaine for mouth pain. Swish with the lidocaine and spit it out. Do not swallow it.  Please take all of your antibiotics until finished!   You may develop abdominal discomfort or diarrhea from the antibiotic.  You may help offset this with probiotics which you can buy or get in yogurt. Do not eat or take the probiotics until 2 hours after your antibiotic. Do not take your medicine if develop an itchy rash, swelling in your mouth or lips, or difficulty breathing.   Home care instructions:  Follow any educational materials contained in this packet.  Follow-up instructions: Please follow-up with your dentist for further evaluation of your symptoms.   Dental Assistance: See handout for dental referrals  Return instructions:  Please return to the Emergency Department if you experience worsening symptoms. Please return if you develop a fever, you develop more swelling in your face or neck, you have trouble breathing or swallowing food. Please return if you have any other emergent concerns.  Additional Information:  Your vital signs today were: BP (!) 125/95 (BP Location: Right Arm)    Pulse 99    Temp 99.3 F (37.4 C) (Oral)    Resp 18    Ht 5\' 10"  (1.778 m)    Wt 71.2 kg    SpO2 98%    BMI 22.53 kg/m  If your blood pressure (BP) was elevated above 135/85 this visit, please have this repeated by your doctor within one month. --------------

## 2020-03-11 ENCOUNTER — Emergency Department (HOSPITAL_COMMUNITY): Payer: Self-pay

## 2020-03-11 ENCOUNTER — Encounter (HOSPITAL_COMMUNITY): Payer: Self-pay | Admitting: Emergency Medicine

## 2020-03-11 ENCOUNTER — Emergency Department (HOSPITAL_COMMUNITY)
Admission: EM | Admit: 2020-03-11 | Discharge: 2020-03-12 | Disposition: A | Discharge status: Home / Self Care | Payer: Self-pay | Attending: Emergency Medicine | Admitting: Emergency Medicine

## 2020-03-11 ENCOUNTER — Other Ambulatory Visit: Payer: Self-pay

## 2020-03-11 DIAGNOSIS — F17200 Nicotine dependence, unspecified, uncomplicated: Secondary | ICD-10-CM | POA: Insufficient documentation

## 2020-03-11 DIAGNOSIS — R0602 Shortness of breath: Secondary | ICD-10-CM | POA: Insufficient documentation

## 2020-03-11 DIAGNOSIS — J069 Acute upper respiratory infection, unspecified: Secondary | ICD-10-CM | POA: Insufficient documentation

## 2020-03-11 DIAGNOSIS — Z20822 Contact with and (suspected) exposure to covid-19: Secondary | ICD-10-CM | POA: Insufficient documentation

## 2020-03-11 DIAGNOSIS — M791 Myalgia, unspecified site: Secondary | ICD-10-CM | POA: Insufficient documentation

## 2020-03-11 DIAGNOSIS — F1590 Other stimulant use, unspecified, uncomplicated: Secondary | ICD-10-CM | POA: Insufficient documentation

## 2020-03-11 LAB — COMPREHENSIVE METABOLIC PANEL
ALT: 15 U/L (ref 0–44)
AST: 17 U/L (ref 15–41)
Albumin: 3.9 g/dL (ref 3.5–5.0)
Alkaline Phosphatase: 108 U/L (ref 38–126)
Anion gap: 10 (ref 5–15)
BUN: 7 mg/dL (ref 6–20)
CO2: 23 mmol/L (ref 22–32)
Calcium: 8.9 mg/dL (ref 8.9–10.3)
Chloride: 104 mmol/L (ref 98–111)
Creatinine, Ser: 1.05 mg/dL (ref 0.61–1.24)
GFR, Estimated: >60 mL/min (ref >60)
Glucose, Bld: 101 mg/dL — ABNORMAL HIGH (ref 70–99)
Potassium: 3.4 mmol/L — ABNORMAL LOW (ref 3.5–5.1)
Sodium: 137 mmol/L (ref 135–145)
Total Bilirubin: 0.7 mg/dL (ref 0.3–1.2)
Total Protein: 8.9 g/dL — ABNORMAL HIGH (ref 6.5–8.1)

## 2020-03-11 LAB — CBC WITH DIFFERENTIAL/PLATELET
Abs Immature Granulocytes: 0.05 10*3/uL (ref 0.00–0.07)
Basophils Absolute: 0.1 10*3/uL (ref 0.0–0.1)
Basophils Relative: 1 %
Eosinophils Absolute: 0.1 10*3/uL (ref 0.0–0.5)
Eosinophils Relative: 1 %
HCT: 40.7 % (ref 39.0–52.0)
Hemoglobin: 13.6 g/dL (ref 13.0–17.0)
Immature Granulocytes: 0 %
Lymphocytes Relative: 15 %
Lymphs Abs: 2 10*3/uL (ref 0.7–4.0)
MCH: 25.3 pg — ABNORMAL LOW (ref 26.0–34.0)
MCHC: 33.4 g/dL (ref 30.0–36.0)
MCV: 75.8 fL — ABNORMAL LOW (ref 80.0–100.0)
Monocytes Absolute: 1.5 10*3/uL — ABNORMAL HIGH (ref 0.1–1.0)
Monocytes Relative: 12 %
Neutro Abs: 9.4 10*3/uL — ABNORMAL HIGH (ref 1.7–7.7)
Neutrophils Relative %: 71 %
Platelets: 411 10*3/uL — ABNORMAL HIGH (ref 150–400)
RBC: 5.37 MIL/uL (ref 4.22–5.81)
RDW: 13.9 % (ref 11.5–15.5)
WBC: 13.1 10*3/uL — ABNORMAL HIGH (ref 4.0–10.5)
nRBC: 0 % (ref 0.0–0.2)

## 2020-03-11 LAB — URINALYSIS, ROUTINE W REFLEX MICROSCOPIC
Bacteria, UA: NONE SEEN
Bilirubin Urine: NEGATIVE
Glucose, UA: NEGATIVE mg/dL
Hgb urine dipstick: NEGATIVE
Ketones, ur: NEGATIVE mg/dL
Leukocytes,Ua: NEGATIVE
Nitrite: NEGATIVE
Protein, ur: 30 mg/dL — AB
Specific Gravity, Urine: 1.006 (ref 1.005–1.030)
pH: 6 (ref 5.0–8.0)

## 2020-03-11 LAB — LACTIC ACID, PLASMA: Lactic Acid, Venous: 1.1 mmol/L (ref 0.5–1.9)

## 2020-03-11 LAB — RESPIRATORY PANEL BY RT PCR (FLU A&B, COVID)
Influenza A by PCR: NEGATIVE
Influenza B by PCR: NEGATIVE
SARS Coronavirus 2 by RT PCR: NEGATIVE

## 2020-03-11 MED ORDER — OXYCODONE-ACETAMINOPHEN 5-325 MG PO TABS
1.0000 | ORAL_TABLET | ORAL | Status: DC | PRN
  Administered 2020-03-11: 1 via ORAL
  Filled 2020-03-11: qty 1

## 2020-03-11 MED ORDER — ALBUTEROL SULFATE HFA 108 (90 BASE) MCG/ACT IN AERS
1.0000 | INHALATION_SPRAY | Freq: Once | RESPIRATORY_TRACT | Status: AC
  Administered 2020-03-11: 2 via RESPIRATORY_TRACT
  Filled 2020-03-11: qty 6.7

## 2020-03-11 NOTE — ED Notes (Signed)
Pt c/o worsening SOB, and CP. Pt audibly wheezing. Repeat EKG and given inhaler

## 2020-03-11 NOTE — ED Triage Notes (Addendum)
Exposed to covid a few days ago-- now has body aches, cough -- occasionally productive for white sputum-- has had a fever at home. Taking Mucinex OTC   Is unvaccinated

## 2020-03-12 MED ORDER — PREDNISONE 20 MG PO TABS: ORAL_TABLET | ORAL | 0 refills | Status: DC

## 2020-03-12 MED ORDER — AZITHROMYCIN 250 MG PO TABS
500.0000 mg | ORAL_TABLET | Freq: Once | ORAL | Status: AC
  Administered 2020-03-12: 500 mg via ORAL
  Filled 2020-03-12: qty 2

## 2020-03-12 MED ORDER — PREDNISONE 20 MG PO TABS
60.0000 mg | ORAL_TABLET | Freq: Once | ORAL | Status: AC
Start: 2020-03-12 — End: 2020-03-12
  Administered 2020-03-12: 60 mg via ORAL
  Filled 2020-03-12: qty 3

## 2020-03-12 MED ORDER — BENZONATATE 100 MG PO CAPS: 100.0000 mg | ORAL_CAPSULE | Freq: Three times a day (TID) | ORAL | 0 refills | Status: DC

## 2020-03-12 MED ORDER — AZITHROMYCIN 250 MG PO TABS: 250.0000 mg | ORAL_TABLET | Freq: Every day | ORAL | 0 refills | Status: DC

## 2020-03-12 NOTE — ED Notes (Signed)
Pt reports large relief of SOB w/ inhaler. Wheezing not heard audibly at this point
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
## 2020-11-18 ENCOUNTER — Emergency Department (HOSPITAL_COMMUNITY): Payer: Self-pay

## 2020-11-18 ENCOUNTER — Other Ambulatory Visit: Payer: Self-pay

## 2020-11-18 ENCOUNTER — Emergency Department (HOSPITAL_COMMUNITY)
Admission: EM | Admit: 2020-11-18 | Discharge: 2020-11-19 | Disposition: A | Discharge status: Home / Self Care | Payer: Self-pay | Attending: Emergency Medicine | Admitting: Emergency Medicine

## 2020-11-18 ENCOUNTER — Encounter (HOSPITAL_COMMUNITY): Payer: Self-pay

## 2020-11-18 DIAGNOSIS — K5900 Constipation, unspecified: Secondary | ICD-10-CM | POA: Insufficient documentation

## 2020-11-18 DIAGNOSIS — F17210 Nicotine dependence, cigarettes, uncomplicated: Secondary | ICD-10-CM | POA: Insufficient documentation

## 2020-11-18 DIAGNOSIS — K529 Noninfective gastroenteritis and colitis, unspecified: Secondary | ICD-10-CM | POA: Insufficient documentation

## 2020-11-18 LAB — COMPREHENSIVE METABOLIC PANEL
ALT: 17 U/L (ref 0–44)
AST: 18 U/L (ref 15–41)
Albumin: 3.3 g/dL — ABNORMAL LOW (ref 3.5–5.0)
Alkaline Phosphatase: 65 U/L (ref 38–126)
Anion gap: 10 (ref 5–15)
BUN: 11 mg/dL (ref 6–20)
CO2: 25 mmol/L (ref 22–32)
Calcium: 8.6 mg/dL — ABNORMAL LOW (ref 8.9–10.3)
Chloride: 103 mmol/L (ref 98–111)
Creatinine, Ser: 1.09 mg/dL (ref 0.61–1.24)
GFR, Estimated: >60 mL/min (ref >60)
Glucose, Bld: 82 mg/dL (ref 70–99)
Potassium: 3.7 mmol/L (ref 3.5–5.1)
Sodium: 138 mmol/L (ref 135–145)
Total Bilirubin: 0.8 mg/dL (ref 0.3–1.2)
Total Protein: 7.9 g/dL (ref 6.5–8.1)

## 2020-11-18 LAB — URINALYSIS, ROUTINE W REFLEX MICROSCOPIC
Bacteria, UA: NONE SEEN
Bilirubin Urine: NEGATIVE
Glucose, UA: NEGATIVE mg/dL
Hgb urine dipstick: NEGATIVE
Ketones, ur: 5 mg/dL — AB
Leukocytes,Ua: NEGATIVE
Nitrite: NEGATIVE
Protein, ur: 30 mg/dL — AB
Specific Gravity, Urine: 1.021 (ref 1.005–1.030)
pH: 5 (ref 5.0–8.0)

## 2020-11-18 LAB — CBC WITH DIFFERENTIAL/PLATELET
Abs Immature Granulocytes: 0.05 10*3/uL (ref 0.00–0.07)
Basophils Absolute: 0.1 10*3/uL (ref 0.0–0.1)
Basophils Relative: 1 %
Eosinophils Absolute: 0.2 10*3/uL (ref 0.0–0.5)
Eosinophils Relative: 3 %
HCT: 35.1 % — ABNORMAL LOW (ref 39.0–52.0)
Hemoglobin: 11.7 g/dL — ABNORMAL LOW (ref 13.0–17.0)
Immature Granulocytes: 1 %
Lymphocytes Relative: 27 %
Lymphs Abs: 2 10*3/uL (ref 0.7–4.0)
MCH: 25.5 pg — ABNORMAL LOW (ref 26.0–34.0)
MCHC: 33.3 g/dL (ref 30.0–36.0)
MCV: 76.5 fL — ABNORMAL LOW (ref 80.0–100.0)
Monocytes Absolute: 0.9 10*3/uL (ref 0.1–1.0)
Monocytes Relative: 12 %
Neutro Abs: 4.2 10*3/uL (ref 1.7–7.7)
Neutrophils Relative %: 56 %
Platelets: 546 10*3/uL — ABNORMAL HIGH (ref 150–400)
RBC: 4.59 MIL/uL (ref 4.22–5.81)
RDW: 13.2 % (ref 11.5–15.5)
WBC: 7.3 10*3/uL (ref 4.0–10.5)
nRBC: 0 % (ref 0.0–0.2)

## 2020-11-18 LAB — LIPASE, BLOOD: Lipase: 35 U/L (ref 11–51)

## 2020-11-18 MED ORDER — LACTATED RINGERS IV BOLUS
1000.0000 mL | Freq: Once | INTRAVENOUS | Status: AC
  Administered 2020-11-18: 1000 mL via INTRAVENOUS

## 2020-11-18 MED ORDER — IOHEXOL 300 MG/ML  SOLN
100.0000 mL | Freq: Once | INTRAMUSCULAR | Status: AC | PRN
  Administered 2020-11-18: 100 mL via INTRAVENOUS

## 2020-11-18 NOTE — Discharge Instructions (Addendum)
Today your CAT scan showed you had inflammation of your bowels which seems to slowly be getting better.  Gradually go back to your normal diet but initially avoid fried foods or fatty foods.  Try apple sauce, oatmeal and toast.  You can have vegetables and fruits and lean meats.  If you start to have worsening pain in the next 1-2 days or get a fever or vomiting you need to come back to the ER.

## 2020-11-18 NOTE — ED Provider Notes (Signed)
[facility]ENCY [facility] Provider Note   CSN: [id number] Arrival date & time: 11/18/20  1255     History Chief Complaint  Patient presents with   Abdominal Pain   Constipation    Steven PentaJames C Biehl is a 45 y.o. male.  Patient is a 45 year old male with a history of prior TB and chronic right hip injury who walks with a cane presenting today with complaints of abdominal pain.  Patient reports symptoms of been present for approximately 10 days.  He started when he was having intercourse and was about to climax and felt like when he attempted to ejaculate it went the opposite way.  He had a severe pain in his stomach which he reported went all the way up into his chest.  Since that time he has had abdominal pain and urinary issues.  It was worse 10 days ago and has gradually started getting better.  He reports for 5 days he did not have a bowel movement and was straining to go to the bathroom at all.  He has been eating very light foods and clear liquids but is finally started having regular bowel movements again.  Initially he was straining to urinate with a weak stream and felt like he was having to strain to get the urine out but that has been gradually improving as well.  He has not noticed any blood in his urine or stool.  He has not had any pain to his penis, deformity and has been able to achieve an erection since the event.  He has no testicular pain or swelling.  He denies any nausea or vomiting.  No fevers.  No prior abdominal surgeries.  He now is having pain in his lower abdomen which is better than it was but still present.  He denies any chest pain or shortness of breath.  The history is provided by the patient.  Abdominal Pain Associated symptoms: constipation   Constipation Associated symptoms: abdominal pain       Past Medical History:  Diagnosis Date   TB (pulmonary tuberculosis)     There are no problems to display for this patient.   History  reviewed. No pertinent surgical history.     No family history on file.  Social History   Tobacco Use   Smoking status: Every Day    Packs/day: 1.00    Types: Cigarettes   Smokeless tobacco: Never  Substance Use Topics   Alcohol use: Yes    Comment: occ   Drug use: Yes    Types: Marijuana    Home Medications Prior to Admission medications   Medication Sig Start Date End Date Taking? Authorizing Provider  azithromycin (ZITHROMAX) 250 MG tablet Take 1 tablet (250 mg total) by mouth daily. Take first 2 tablets together, then 1 every day until finished. 03/12/20   Mesner, Barbara CowerJason, MD  benzonatate (TESSALON) 100 MG capsule Take 1 capsule (100 mg total) by mouth every 8 (eight) hours. 03/12/20   Mesner, Barbara CowerJason, MD  ibuprofen (ADVIL,MOTRIN) 600 MG tablet Take 1 tablet (600 mg total) by mouth every 6 (six) hours as needed. 01/28/18   Maczis, Elmer SowMichael M, PA-C  lidocaine (XYLOCAINE) 2 % solution Use as directed 15 mLs in the mouth or throat as needed for mouth pain. 01/28/18   Maczis, Elmer SowMichael M, PA-C  predniSONE (DELTASONE) 20 MG tablet 3 tabs po day one, then 2 po daily x 4 days 03/12/20   Mesner, Barbara CowerJason, MD    Allergies  Patient has no known allergies.  Review of Systems   Review of Systems  Gastrointestinal:  Positive for abdominal pain and constipation.  All other systems reviewed and are negative.  Physical Exam Updated Vital Signs BP 129/89   Pulse 92   Temp 98 F (36.7 C) (Oral)   Resp 16   SpO2 100%   Physical Exam Vitals and nursing note reviewed.  Constitutional:      General: He is not in acute distress.    Appearance: Normal appearance. He is well-developed and normal weight.  HENT:     Head: Normocephalic and atraumatic.     Mouth/Throat:     Mouth: Mucous membranes are moist.  Eyes:     Conjunctiva/sclera: Conjunctivae normal.     Pupils: Pupils are equal, round, and reactive to light.  Cardiovascular:     Rate and Rhythm: Normal rate and regular rhythm.      Pulses: Normal pulses.     Heart sounds: No murmur heard. Pulmonary:     Effort: Pulmonary effort is normal. No respiratory distress.     Breath sounds: Normal breath sounds. No wheezing or rales.  Abdominal:     General: Abdomen is flat. Bowel sounds are normal. There is no distension.     Palpations: Abdomen is soft.     Tenderness: There is abdominal tenderness in the suprapubic area and left lower quadrant. There is no right CVA tenderness, left CVA tenderness, guarding or rebound.     Hernia: No hernia is present. There is no hernia in the left inguinal area or right inguinal area.  Genitourinary:    Penis: Normal.      Testes: Normal.     Comments: No evidence of penile deformity, swelling or tenderness to the shaft.  No lesions present. Musculoskeletal:        General: No tenderness. Normal range of motion.     Cervical back: Normal range of motion and neck supple.     Right lower leg: No edema.     Left lower leg: No edema.  Skin:    General: Skin is warm and dry.     Findings: No erythema or rash.  Neurological:     Mental Status: He is alert and oriented to person, place, and time. Mental status is at baseline.  Psychiatric:        Mood and Affect: Mood normal.        Behavior: Behavior normal.    ED Results / Procedures / Treatments   Labs (all labs ordered are listed, but only abnormal results are displayed) Labs Reviewed  CBC WITH DIFFERENTIAL/PLATELET - Abnormal; Notable for the following components:      Result Value   Hemoglobin 11.7 (*)    HCT 35.1 (*)    MCV 76.5 (*)    MCH 25.5 (*)    Platelets 546 (*)    All other components within normal limits  COMPREHENSIVE METABOLIC PANEL - Abnormal; Notable for the following components:   Calcium 8.6 (*)    Albumin 3.3 (*)    All other components within normal limits  URINALYSIS, ROUTINE W REFLEX MICROSCOPIC - Abnormal; Notable for the following components:   APPearance HAZY (*)    Ketones, ur 5 (*)     Protein, ur 30 (*)    All other components within normal limits  LIPASE, BLOOD    EKG None  Radiology CT ABDOMEN PELVIS W CONTRAST  Result Date: 11/18/2020 CLINICAL DATA:  Diverticulitis suspected abdominal  pain EXAM: CT ABDOMEN AND PELVIS WITH CONTRAST TECHNIQUE: Multidetector CT imaging of the abdomen and pelvis was performed using the standard protocol following bolus administration of intravenous contrast. CONTRAST:  OMNIPAQUE IOHEXOL 300 MG/ML  SOLN COMPARISON:  None. FINDINGS: Lower chest: Lung bases are clear. No effusions. Heart is normal size. Hepatobiliary: No focal hepatic abnormality. Gallbladder unremarkable. Pancreas: No focal abnormality or ductal dilatation. Spleen: No focal abnormality.  Normal size. Adrenals/Urinary Tract: No adrenal abnormality. No focal renal abnormality. No stones or hydronephrosis. Urinary bladder is unremarkable. Stomach/Bowel: Within the right lower quadrant, there are prominent distal ileal small bowel loops. In addition, the appendix appears to pass into this area of prominent small bowel with there appears to be inflammation. Appendix is mildly prominent measuring up to 9 mm in diameter. This could reflect appendicitis with secondary inflammation of the adjacent distal ileum, for distal ileal enteritis with secondary inflammation of the appendix. Large bowel is decompressed and unremarkable. Stomach unremarkable. There are mildly prominent right lower quadrant mesenteric lymph nodes. Vascular/Lymphatic: Mildly prominent right lower quadrant mesenteric lymph nodes. No aneurysm. Reproductive: No visible focal abnormality. Other: No free fluid or free air. Musculoskeletal: No acute bony abnormality. There appears to be ankylosis of the SI joints and across the lumbar spine disc spaces suggesting ankylosing spondylitis. IMPRESSION: Prominent, inflamed appearing small bowel loops in the distal ileal region. Appendix is also in this area and appears prominent.  It is difficult to determine if this represents appendicitis with adjacent reactive enteritis, or infectious/inflammatory enteritis with reactive inflammation of the appendix. Mildly prominent right lower quadrant mesenteric lymph nodes. Electronically Signed   By: Charlett Nose M.D.   On: 11/18/2020 22:55    Procedures Procedures   Medications Ordered in ED Medications - No data to display  ED Course  I have reviewed the triage vital signs and the nursing notes.  Pertinent labs & imaging results that were available during my care of the patient were reviewed by me and considered in my medical decision making (see chart for details).    MDM Rules/Calculators/A&P                           Patient is a 45 year old male presenting with vague abdominal pain for 10 days which he reports occurred while having sex 10 days ago.  He was complaining of decrease in his stream of urine and straining to get the urine out but that is gradually improving.  UA today without evidence of infection and no exam findings concerning for a fractured penis or acute testicular cause.  There are no exam findings consistent with hernia.  However given patient's abdominal pain worse with eating and constipation concern for possible diverticulitis.  UA is within normal limits, CBC without acute findings, CMP and lipase are normal.  CT to further evaluate is pending.  Patient's vital signs are within normal limits.  11:03 PM CT shows prominent inflamed appearing small bowel loops in the distal ileum region.  Appendix is also in this area and appears prominent.  Difficult to determine if this is appendicitis versus reactive enteritis.  Has been more diffuse lower abdominal pain not specific to the right lower quadrant.  There is mildly prominent right lower quadrant mesenteric lymph nodes.  Will discuss with general surgery.  11:19 PM Spoke with Dr. Derrell Lolling with general surgery who also reviewed the films and at this time  low suspicion for appendicitis given pt's sx have  been ongoing now for 10 days at least.  Benign abd exam and on repeat exam pt has NO RLQ pain.  Suspect improving enteritis.  Pt given return precautions if he develops worsening pain, fever or vomiting.  Will have gradually reintroduce food and pt was d/ced home.  MDM   Amount and/or Complexity of Data Reviewed Clinical lab tests: ordered and reviewed Tests in the radiology section of CPT: ordered and reviewed Discuss the patient with other providers: yes Independent visualization of images, tracings, or specimens: yes     Final Clinical Impression(s) / ED Diagnoses Final diagnoses:  Enteritis    Rx / DC Orders ED Discharge Orders     None        Gwyneth Sprout, MD 11/18/20 2324

## 2020-11-18 NOTE — ED Triage Notes (Signed)
Pt reports abd pain for the past 2 weeks after having intercourse. Pt states "I was trying to hold in my orgasm and I felt like it went inside my stomach and not out" Pt also report constipation for the past few days but did have a BM this morning.

## 2020-11-18 NOTE — ED Provider Notes (Signed)
Emergency Medicine Provider Triage Evaluation Note  Steven Jacobs , a 45 y.o. male  was evaluated in triage.  Pt complains of lower abd pain that has been present for 2 weeks.  Review of Systems  Positive: Abd pain Negative: fever  Physical Exam  BP 130/75 (BP Location: Right Arm)   Pulse (!) 123   Temp 98.4 F (36.9 C) (Oral)   Resp 16   SpO2 97%  Gen:   Awake, no distress   Resp:  Normal effort  MSK:   Moves extremities without difficulty  Other:    Medical Decision Making  Medically screening exam initiated at 1:41 PM.  Appropriate orders placed.  [name] was informed that the remainder of the evaluation will be completed by another provider, this initial triage assessment does not replace that evaluation, and the importance of remaining in the ED until their evaluation is complete.     Karrie Meres, PA-C 11/18/20 1342    Cathren Laine, MD 11/19/20 1245

## 2020-11-18 NOTE — ED Notes (Signed)
Steven Jacobs [phone] would like an update

## 2021-01-05 ENCOUNTER — Other Ambulatory Visit: Payer: Self-pay

## 2021-01-05 ENCOUNTER — Encounter: Payer: Self-pay | Admitting: Podiatrist

## 2021-01-05 ENCOUNTER — Ambulatory Visit (INDEPENDENT_AMBULATORY_CARE_PROVIDER_SITE_OTHER): Payer: Self-pay | Admitting: Podiatrist

## 2021-01-05 DIAGNOSIS — L600 Ingrowing nail: Secondary | ICD-10-CM

## 2021-01-05 DIAGNOSIS — L608 Other nail disorders: Secondary | ICD-10-CM

## 2021-01-05 NOTE — Patient Instructions (Signed)

## 2021-01-05 NOTE — Progress Notes (Signed)
Chief Complaint  Patient presents with   Nail Problem    Bilateral 1st toenails lateral borders painful and tip of nail painful.     HPI: Patient is 45 y.o. male who presents today for pain on the corners of both great toenails.  He states that the nails grow in to the skin and cause pain especially if he accidentally hits the toes.  He relates he is unable to trim the nails due to the pain.  He also has a problem with his hip and is unable to trim his nails himself.  There are no problems to display for this patient.   Current Outpatient Medications on File Prior to Visit  Medication Sig Dispense Refill   azithromycin (ZITHROMAX) 250 MG tablet Take 1 tablet (250 mg total) by mouth daily. Take first 2 tablets together, then 1 every day until finished. (Patient not taking: Reported on 01/05/2021) 6 tablet 0   benzonatate (TESSALON) 100 MG capsule Take 1 capsule (100 mg total) by mouth every 8 (eight) hours. (Patient not taking: Reported on 01/05/2021) 21 capsule 0   ibuprofen (ADVIL,MOTRIN) 600 MG tablet Take 1 tablet (600 mg total) by mouth every 6 (six) hours as needed. (Patient not taking: Reported on 01/05/2021) 30 tablet 0   lidocaine (XYLOCAINE) 2 % solution Use as directed 15 mLs in the mouth or throat as needed for mouth pain. (Patient not taking: Reported on 01/05/2021) 100 mL 0   predniSONE (DELTASONE) 20 MG tablet 3 tabs po day one, then 2 po daily x 4 days (Patient not taking: Reported on 01/05/2021) 11 tablet 0   No current facility-administered medications on file prior to visit.    No Known Allergies  Review of Systems No fevers, chills, nausea, muscle aches, no difficulty breathing, no calf pain, no chest pain or shortness of breath.   Physical Exam  GENERAL APPEARANCE: Alert, conversant. Appropriately groomed. No acute distress.   VASCULAR: Pedal pulses palpable DP and PT bilateral.  Capillary refill time is immediate to all digits,  Proximal to distal cooling it warm to  warm.  Digital perfusion adequate.   NEUROLOGIC: sensation is intact to 5.07 monofilament at 5/5 sites bilateral.  Light touch is intact bilateral, vibratory sensation intact bilateral  MUSCULOSKELETAL: acceptable muscle strength, tone and stability bilateral.  No gross boney pedal deformities noted.  No pain, crepitus or limitation noted with foot and ankle range of motion bilateral.   DERMATOLOGIC: skin is warm, supple, and dry.  No open lesions noted.  No rash, no pre ulcerative lesions.  Both hallux nails have a pincer nail type deformity and they are ingrowing on the medial and lateral nail borders.  Pain is noted with direct pressure of great toenails bilateral.  Right great toenail does appear to have subungual debris present and likely is fungal in nature.    Assessment   1. Ingrown toenail of both feet   2. Pincer nail deformity      Plan  Discussed treatment options and alternatives with the patient.  Discussed nail trim versus excision of the offending nail borders and the patient would like the toes fixed.  I did agree I prepped the skin with alcohol and infiltrated lidocaine and Marcaine mixture in a digital block fashion on bilateral hallux.  The toes were then prepped with Betadine and exsanguinated and the medial and lateral nail borders were then sharply removed from distal to proximal.  The matrix tissue was exposed and phenol was applied.  Alcohol  wash was then performed and Silvadene cream and a dry sterile compressive dressing was applied to bilateral hallux.  She was given instruction for soaks and aftercare and was instructed to soak and keep the toes covered with antibiotic ointment and a Band-Aid for 2 weeks's post procedure.  If anytime he notices any redness, swelling, drainage, pain or sign of infection he is instructed to call to be seen.

## 2021-06-15 IMAGING — DX DG CHEST 1V PORT
1 series · 1 of 1 positions shown · non-contrast
Comparison: March 29, 2016

CLINICAL DATA: COVID symptoms.

EXAM:
PORTABLE CHEST 1 VIEW

[chest ap]
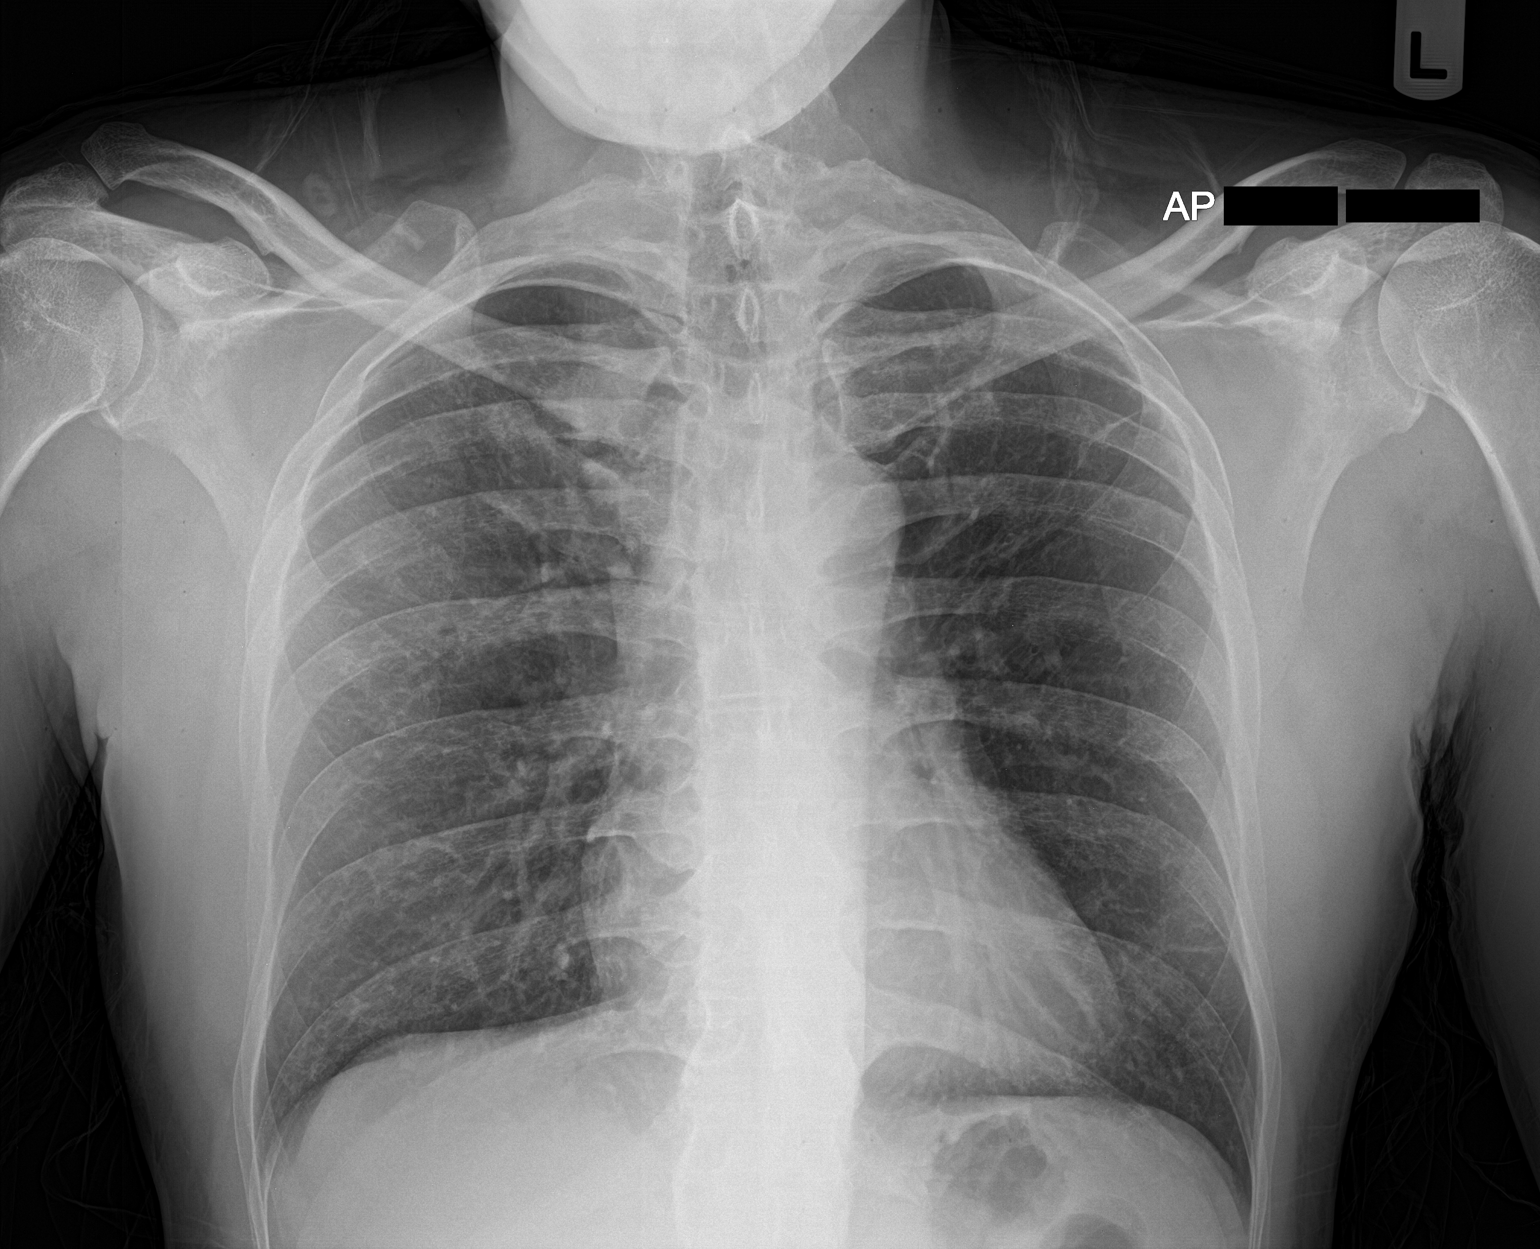

[1 of 1 positions shown; findings below may reference images not displayed]

FINDINGS: The heart, hila, and mediastinum are unremarkable. No pneumothorax.
The left lung is clear. Mild haziness over the right chest could be
due to patient rotation. No focal infiltrates. No other acute
abnormalities.
IMPRESSION: No definite focal infiltrate. Mild haziness over the right chest
could be due to mild patient rotation. No other acute abnormalities.

## 2022-02-22 IMAGING — CT CT ABD-PELV W/ CM
2 of 4 series · 15 of 46 positions shown, 17 images · IV contrast (APPLIED)
Comparison: None.

CLINICAL DATA: Diverticulitis suspected abdominal pain

EXAM:
CT ABDOMEN AND PELVIS WITH CONTRAST
TECHNIQUE: Multidetector CT imaging of the abdomen and pelvis was performed
using the standard protocol following bolus administration of
intravenous contrast.
CONTRAST:  100mL OMNIPAQUE IOHEXOL 300 MG/ML  SOLN

[Series 3: abdomen 5.0 · axial · 0.88mm/px · z∈[+761,+1156]mm · 12 of 89 slices shown, 14 images]
[im 5/89  soft-tissue]
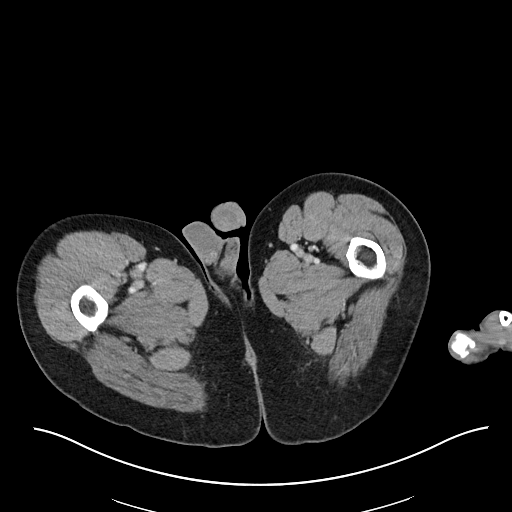
[im 5/89  bone]
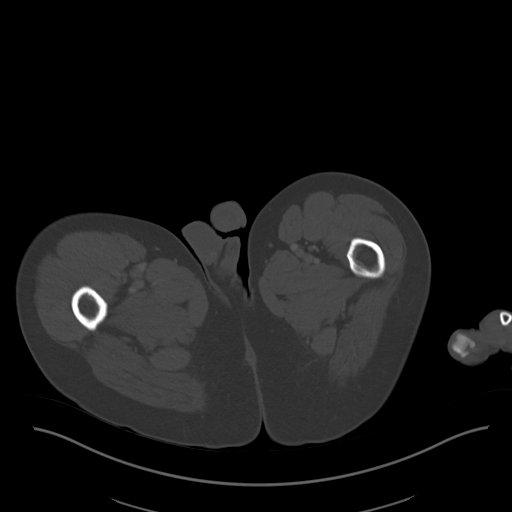
[im 14/89  soft-tissue]
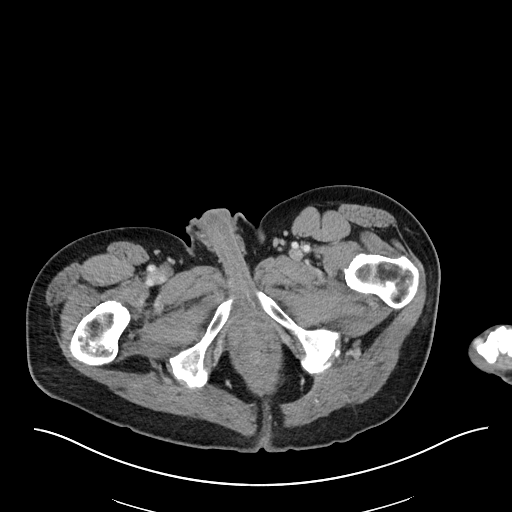
[im 19/89  soft-tissue]
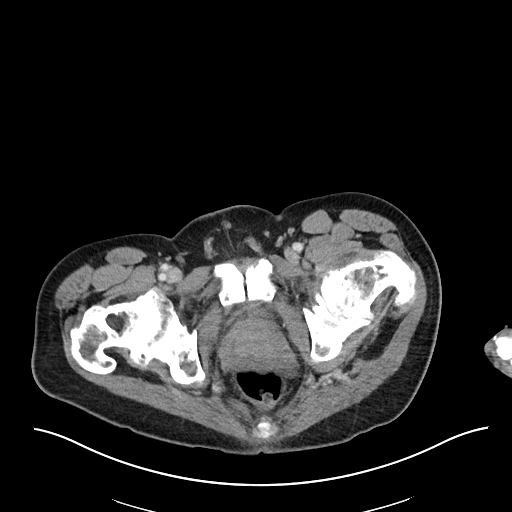
[im 28/89  soft-tissue]
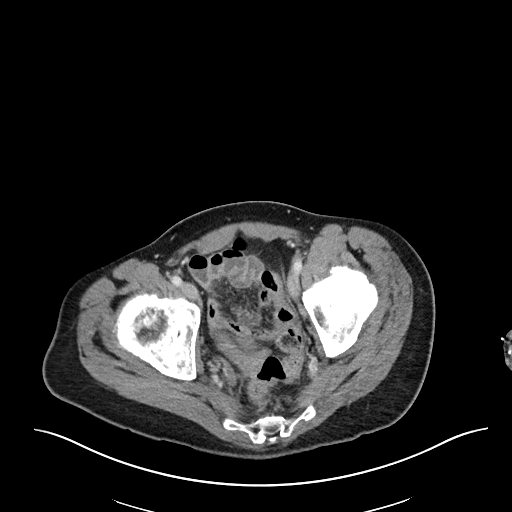
[im 33/89  soft-tissue]
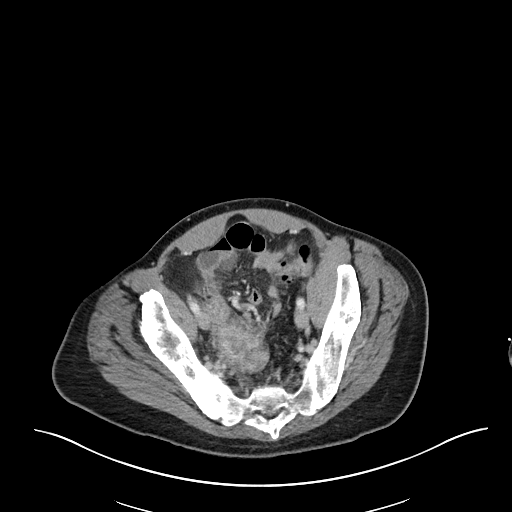
[im 42/89  soft-tissue]
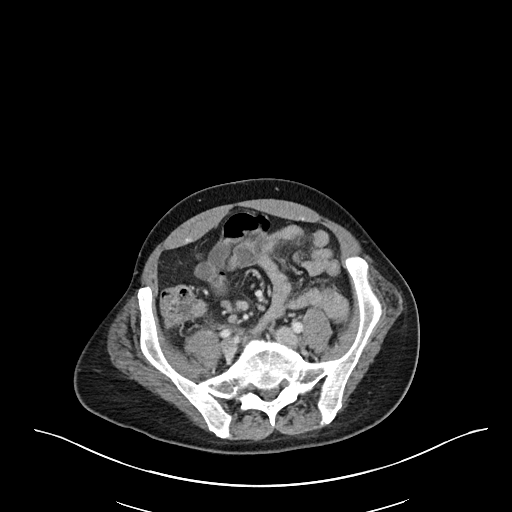
[im 47/89  soft-tissue]
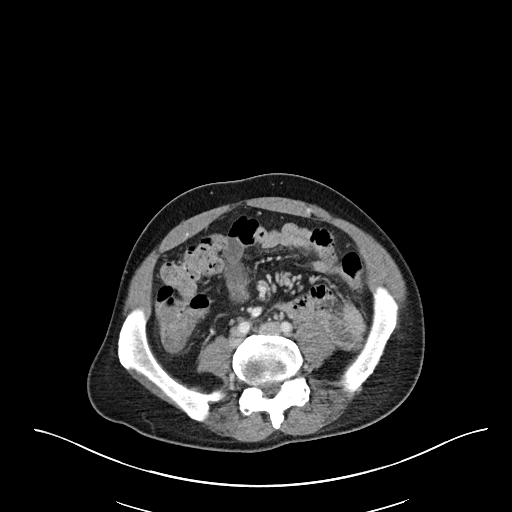
[im 56/89  soft-tissue]
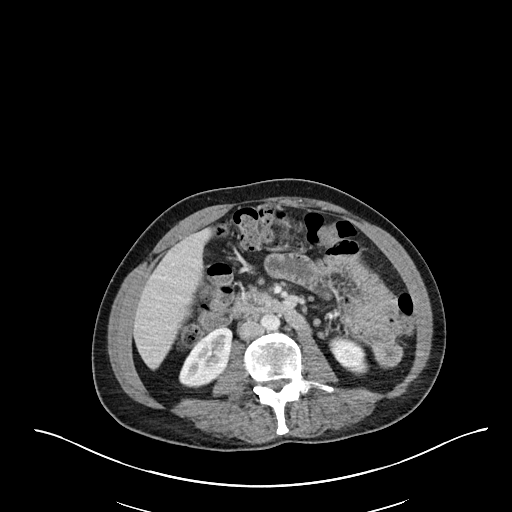
[im 61/89  soft-tissue]
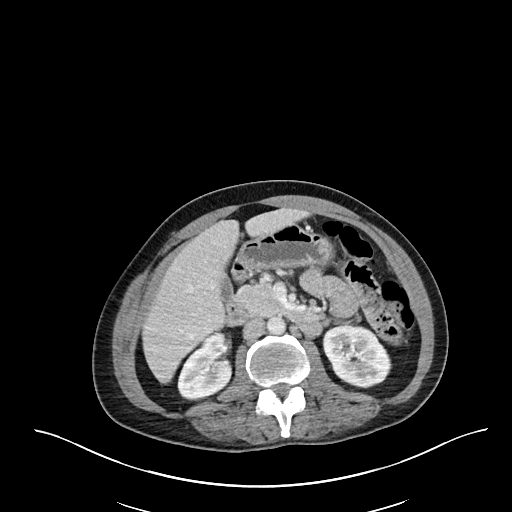
[im 61/89  bone]
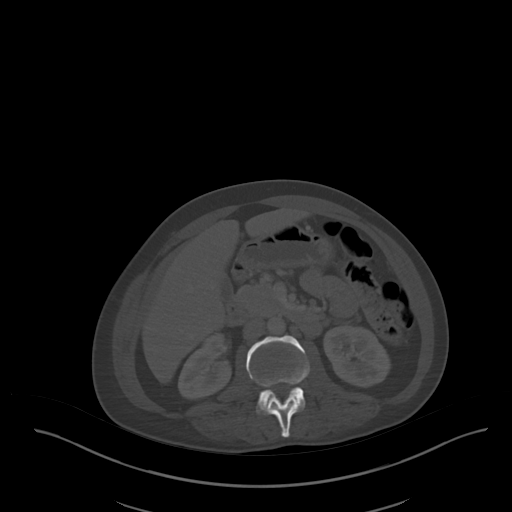
[im 70/89  soft-tissue]
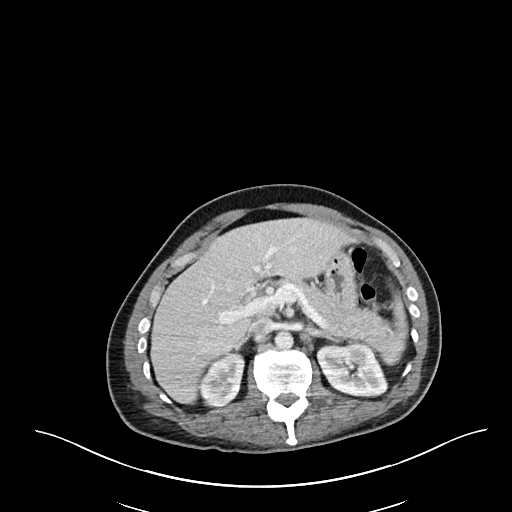
[im 75/89  soft-tissue]
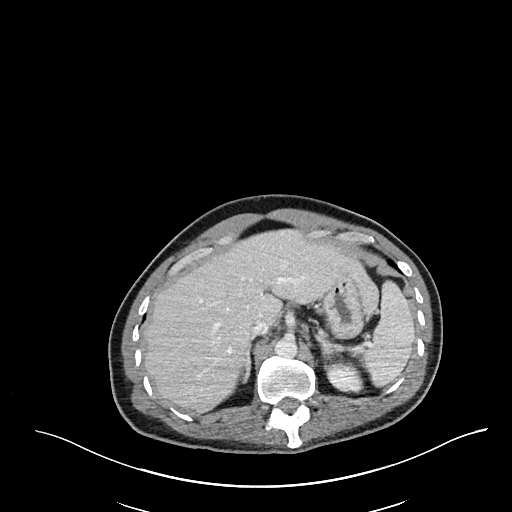
[im 84/89  soft-tissue]
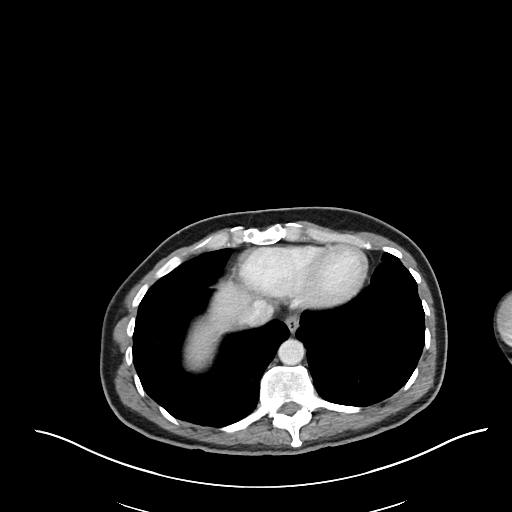

[Series 6: abdomen 3.0 mpr cor · coronal · 0.65mm/px · 3 of 83 slices shown]
[im 28/83  soft-tissue]
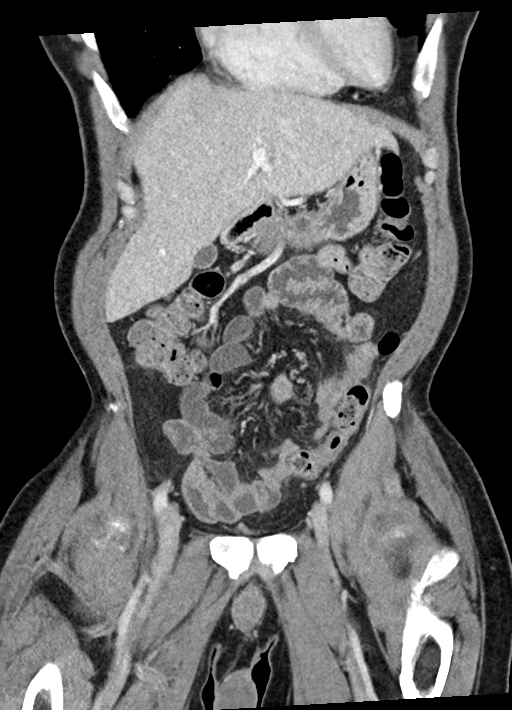
[im 37/83  soft-tissue]
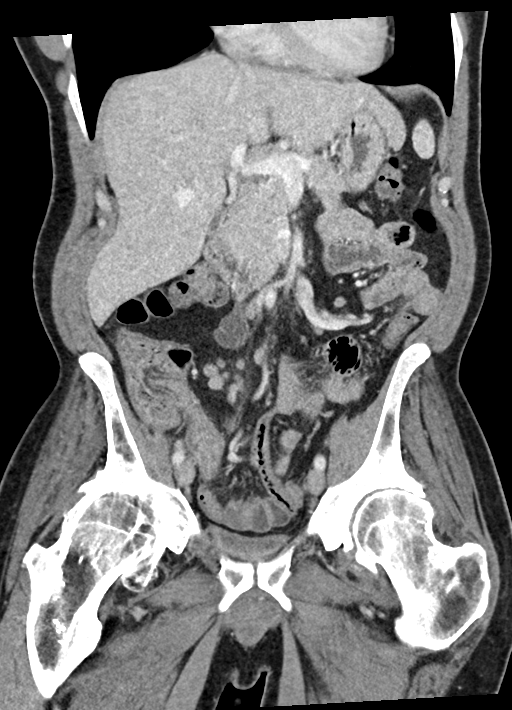
[im 46/83  soft-tissue]
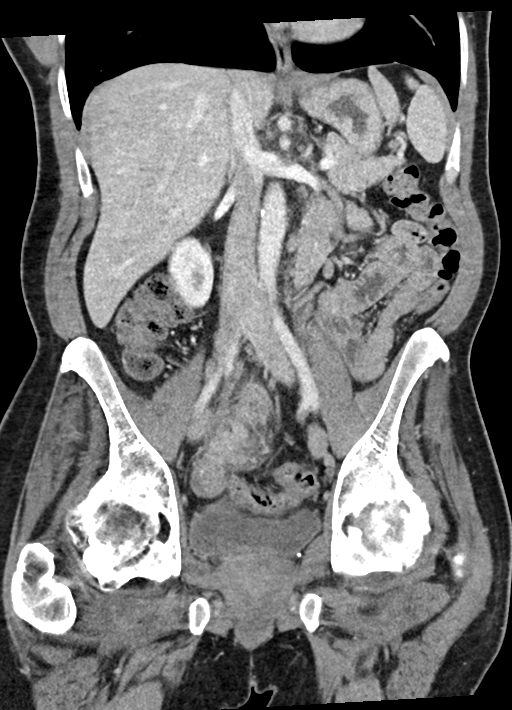

[15 of 46 positions shown; findings below may reference images not displayed]

FINDINGS: Lower chest: Lung bases are clear. No effusions. Heart is normal
size.

Hepatobiliary: No focal hepatic abnormality. Gallbladder
unremarkable.

Pancreas: No focal abnormality or ductal dilatation.

Spleen: No focal abnormality.  Normal size.

Adrenals/Urinary Tract: No adrenal abnormality. No focal renal
abnormality. No stones or hydronephrosis. Urinary bladder is
unremarkable.

Stomach/Bowel: Within the right lower quadrant, there are prominent
distal ileal small bowel loops. In addition, the appendix appears to
pass into this area of prominent small bowel with there appears to
be inflammation. Appendix is mildly prominent measuring up to 9 mm
in diameter. This could reflect appendicitis with secondary
inflammation of the adjacent distal ileum, for distal ileal
enteritis with secondary inflammation of the appendix. Large bowel
is decompressed and unremarkable. Stomach unremarkable. There are
mildly prominent right lower quadrant mesenteric lymph nodes.

Vascular/Lymphatic: Mildly prominent right lower quadrant mesenteric
lymph nodes. No aneurysm.

Reproductive: No visible focal abnormality.

Other: No free fluid or free air.

Musculoskeletal: No acute bony abnormality. There appears to be
ankylosis of the SI joints and across the lumbar spine disc spaces
suggesting ankylosing spondylitis.
IMPRESSION: Prominent, inflamed appearing small bowel loops in the distal ileal
region. Appendix is also in this area and appears prominent. It is
difficult to determine if this represents appendicitis with adjacent
reactive enteritis, or infectious/inflammatory enteritis with
reactive inflammation of the appendix.

Mildly prominent right lower quadrant mesenteric lymph nodes.

## 2022-04-15 ENCOUNTER — Emergency Department (HOSPITAL_COMMUNITY)
Admission: EM | Admit: 2022-04-15 | Discharge: 2022-04-16 | Disposition: A | Discharge status: Home / Self Care | Payer: No Typology Code available for payment source | Attending: Emergency Medicine | Admitting: Emergency Medicine

## 2022-04-15 ENCOUNTER — Emergency Department (HOSPITAL_COMMUNITY): Payer: No Typology Code available for payment source

## 2022-04-15 DIAGNOSIS — J069 Acute upper respiratory infection, unspecified: Secondary | ICD-10-CM | POA: Diagnosis not present

## 2022-04-15 DIAGNOSIS — R519 Headache, unspecified: Secondary | ICD-10-CM | POA: Diagnosis present

## 2022-04-15 MED ORDER — ACETAMINOPHEN 325 MG PO TABS
650.0000 mg | ORAL_TABLET | Freq: Once | ORAL | Status: AC
  Administered 2022-04-16: 650 mg via ORAL
  Filled 2022-04-15: qty 2

## 2022-04-15 NOTE — ED Provider Triage Note (Addendum)
Emergency Medicine Provider Triage Evaluation Note  Steven Jacobs , a 46 y.o. male  was evaluated in triage.  Pt complains of coughx2 days, now shortness of breathx1 day and headache. No N/V/D, sore throat, runny nose.   Review of Systems  Positive: Cough, SOB Negative: Chest pain  Physical Exam  BP (!) 157/95   Pulse (!) 114   Temp 100.3 F (37.9 C)   Resp 20   Ht 5\' 11"  (1.803 m)   Wt 81.6 kg   SpO2 92%   BMI 25.10 kg/m  Gen:   Awake, no distress   Resp:  Normal effort  MSK:   Moves extremities without difficulty   Medical Decision Making  Medically screening exam initiated at 9:50 PM.  Appropriate orders placed.  was informed that the remainder of the evaluation will be completed by another provider, this initial triage assessment does not replace that evaluation, and the importance of remaining in the ED until their evaluation is complete.     Refused swabs  Bobbijo Holst [name], PA 04/15/22 2152    2153, Pete Pelt 04/15/22 2157

## 2022-04-15 NOTE — ED Triage Notes (Signed)
Pt to ED c/o SHOB, HA, Cough x 3 days.

## 2022-04-16 MED ORDER — AZITHROMYCIN 250 MG PO TABS: 250.0000 mg | ORAL_TABLET | Freq: Every day | ORAL | 0 refills | Status: AC

## 2022-04-16 MED ORDER — BENZONATATE 200 MG PO CAPS: 200.0000 mg | ORAL_CAPSULE | Freq: Three times a day (TID) | ORAL | 0 refills | Status: AC | PRN

## 2022-04-16 MED ORDER — PREDNISONE 20 MG PO TABS: 40.0000 mg | ORAL_TABLET | Freq: Every day | ORAL | 0 refills | Status: AC

## 2022-04-16 MED ORDER — ALBUTEROL SULFATE HFA 108 (90 BASE) MCG/ACT IN AERS: 2.0000 | INHALATION_SPRAY | RESPIRATORY_TRACT | Status: DC | PRN

## 2022-04-16 MED ORDER — PREDNISONE 20 MG PO TABS
60.0000 mg | ORAL_TABLET | Freq: Once | ORAL | Status: AC
  Administered 2022-04-16: 60 mg via ORAL
  Filled 2022-04-16: qty 3

## 2022-04-16 MED ORDER — IBUPROFEN 800 MG PO TABS
800.0000 mg | ORAL_TABLET | Freq: Once | ORAL | Status: AC
  Administered 2022-04-16: 800 mg via ORAL
  Filled 2022-04-16: qty 1

## 2022-04-16 NOTE — ED Provider Notes (Signed)
[facility] EMERGENCY DEPARTMENT Provider Note   CSN: [id number] Arrival date & time: 04/15/22  2131     History  Chief Complaint  Patient presents with   Cough   Shortness of Breath   Headache    Steven Jacobs is a 46 y.o. male.  Patient presents to the emergency department for evaluation of headache, nasal congestion and cough.  Patient reports that symptoms have been ongoing for 2 days.  Patient feels some shortness of breath with his cough.  Cough is nonproductive.       Home Medications Prior to Admission medications   Medication Sig Start Date End Date Taking? Authorizing Provider  azithromycin (ZITHROMAX) 250 MG tablet Take 1 tablet (250 mg total) by mouth daily. Take first 2 tablets together, then 1 every day until finished. 04/16/22  Yes Meridith Romick, Canary Brim, MD  benzonatate (TESSALON) 200 MG capsule Take 1 capsule (200 mg total) by mouth 3 (three) times daily as needed for cough. 04/16/22  Yes Ilana Prezioso, Canary Brim, MD  predniSONE (DELTASONE) 20 MG tablet Take 2 tablets (40 mg total) by mouth daily with breakfast. 04/16/22  Yes Tandrea Kommer, Canary Brim, MD      Allergies    Patient has no known allergies.    Review of Systems   Review of Systems  Physical Exam Updated Vital Signs BP (!) 157/95   Pulse (!) 114   Temp 100.3 F (37.9 C)   Resp 20   Ht 5\' 11"  (1.803 m)   Wt 81.6 kg   SpO2 92%   BMI 25.10 kg/m  Physical Exam Vitals and nursing note reviewed.  Constitutional:      General: He is not in acute distress.    Appearance: He is well-developed.  HENT:     Head: Normocephalic and atraumatic.     Mouth/Throat:     Mouth: Mucous membranes are moist.  Eyes:     General: Vision grossly intact. Gaze aligned appropriately.     Extraocular Movements: Extraocular movements intact.     Conjunctiva/sclera: Conjunctivae normal.  Cardiovascular:     Rate and Rhythm: Normal rate and regular rhythm.     Pulses: Normal pulses.      Heart sounds: Normal heart sounds, S1 normal and S2 normal. No murmur heard.    No friction rub. No gallop.  Pulmonary:     Effort: Pulmonary effort is normal. No respiratory distress.     Breath sounds: Normal breath sounds.  Abdominal:     Palpations: Abdomen is soft.     Tenderness: There is no abdominal tenderness. There is no guarding or rebound.     Hernia: No hernia is present.  Musculoskeletal:        General: No swelling.     Cervical back: Full passive range of motion without pain, normal range of motion and neck supple. No pain with movement, spinous process tenderness or muscular tenderness. Normal range of motion.     Right lower leg: No edema.     Left lower leg: No edema.  Skin:    General: Skin is warm and dry.     Capillary Refill: Capillary refill takes less than 2 seconds.     Findings: No ecchymosis, erythema, lesion or wound.  Neurological:     Mental Status: He is alert and oriented to person, place, and time.     GCS: GCS eye subscore is 4. GCS verbal subscore is 5. GCS motor subscore is 6.  Cranial Nerves: Cranial nerves 2-12 are intact.     Sensory: Sensation is intact.     Motor: Motor function is intact. No weakness or abnormal muscle tone.     Coordination: Coordination is intact.  Psychiatric:        Mood and Affect: Mood normal.        Speech: Speech normal.        Behavior: Behavior normal.     ED Results / Procedures / Treatments   Labs (all labs ordered are listed, but only abnormal results are displayed) Labs Reviewed - No data to display  EKG None  Radiology DG Chest 2 View  Result Date: 04/15/2022 CLINICAL DATA:  Dyspnea, cough EXAM: CHEST - 2 VIEW COMPARISON:  03/11/2020 FINDINGS: The heart size and mediastinal contours are within normal limits. Both lungs are clear. The visualized skeletal structures are unremarkable. IMPRESSION: No active cardiopulmonary disease. Electronically Signed   By: Fidela Salisbury M.D.   On: 04/15/2022  22:17    Procedures Procedures    Medications Ordered in ED Medications  acetaminophen (TYLENOL) tablet 650 mg (has no administration in time range)  ibuprofen (ADVIL) tablet 800 mg (has no administration in time range)  predniSONE (DELTASONE) tablet 60 mg (has no administration in time range)  albuterol (VENTOLIN HFA) 108 (90 Base) MCG/ACT inhaler 2 puff (has no administration in time range)    ED Course/ Medical Decision Making/ A&P                           Medical Decision Making  Presents to the emergency department with URI symptoms and low-grade fever.  No meningismus.  Patient has a normal neurologic exam.  Chest x-ray does not show pneumonia..  Treatment for bronchitis.        Final Clinical Impression(s) / ED Diagnoses Final diagnoses:  Upper respiratory tract infection, unspecified type    Rx / DC Orders ED Discharge Orders          Ordered    predniSONE (DELTASONE) 20 MG tablet  Daily with breakfast        04/16/22 0113    benzonatate (TESSALON) 200 MG capsule  3 times daily PRN        04/16/22 0113    azithromycin (ZITHROMAX) 250 MG tablet  Daily        04/16/22 0113              Orpah Greek, MD 04/16/22 [phone]

## 2023-08-18 ENCOUNTER — Other Ambulatory Visit: Payer: Self-pay

## 2023-08-18 ENCOUNTER — Encounter (HOSPITAL_COMMUNITY): Payer: Self-pay

## 2023-08-18 ENCOUNTER — Emergency Department (HOSPITAL_COMMUNITY)
Admission: EM | Admit: 2023-08-18 | Discharge: 2023-08-19 | Disposition: A | Discharge status: Home / Self Care | Payer: Self-pay | Attending: Emergency Medicine | Admitting: Emergency Medicine

## 2023-08-18 DIAGNOSIS — K029 Dental caries, unspecified: Secondary | ICD-10-CM | POA: Insufficient documentation

## 2023-08-18 DIAGNOSIS — K047 Periapical abscess without sinus: Secondary | ICD-10-CM | POA: Insufficient documentation

## 2023-08-18 NOTE — ED Triage Notes (Signed)
 Pt complaining of swelling on the left side of his jaw that started this morning. Getting worse as the day goes on.

## 2023-08-19 MED ORDER — AMOXICILLIN-POT CLAVULANATE 875-125 MG PO TABS
1.0000 | ORAL_TABLET | Freq: Once | ORAL | Status: AC
  Administered 2023-08-19: 1 via ORAL
  Filled 2023-08-19: qty 1

## 2023-08-19 MED ORDER — IBUPROFEN 400 MG PO TABS
600.0000 mg | ORAL_TABLET | Freq: Once | ORAL | Status: AC
  Administered 2023-08-19: 600 mg via ORAL
  Filled 2023-08-19: qty 1

## 2023-08-19 MED ORDER — AMOXICILLIN-POT CLAVULANATE 875-125 MG PO TABS: 1.0000 | ORAL_TABLET | Freq: Two times a day (BID) | ORAL | 0 refills | Status: AC

## 2023-08-19 NOTE — ED Provider Notes (Signed)
 [place] EMERGENCY DEPARTMENT AT [facility] Provider Note   CSN: [id number] Arrival date & time: 08/18/23  2215     History  Chief Complaint  Patient presents with   Facial Swelling    Steven Jacobs is a 48 y.o. male who presents with concern for few days of left lower dental pain and now with left-sided facial swelling without fevers chills nausea vomiting or difficulty swallowing.  Poor dentition.  Has not seen a dentist.  HPI     Home Medications Prior to Admission medications   Medication Sig Start Date End Date Taking? Authorizing Provider  azithromycin  (ZITHROMAX ) 250 MG tablet Take 1 tablet (250 mg total) by mouth daily. Take first 2 tablets together, then 1 every day until finished. 04/16/22   Ballard Bongo, MD  benzonatate  (TESSALON ) 200 MG capsule Take 1 capsule (200 mg total) by mouth 3 (three) times daily as needed for cough. 04/16/22   Ballard Bongo, MD  predniSONE  (DELTASONE ) 20 MG tablet Take 2 tablets (40 mg total) by mouth daily with breakfast. 04/16/22   Pollina, Marine Sia, MD      Allergies    Patient has no known allergies.    Review of Systems   Review of Systems  HENT:  Positive for dental problem and facial swelling.     Physical Exam Updated Vital Signs BP (!) 171/105 (BP Location: Left Arm)   Pulse 97   Temp 98.5 F (36.9 C)   Resp 19   Ht 5\' 11"  (1.803 m)   Wt 86.6 kg   SpO2 99%   BMI 26.64 kg/m  Physical Exam Vitals and nursing note reviewed.  Constitutional:      Appearance: He is not ill-appearing or toxic-appearing.  HENT:     Head: Normocephalic and atraumatic.     Mouth/Throat:     Dentition: Abnormal dentition. Dental tenderness, dental caries and dental abscesses present.     Pharynx: Oropharynx is clear. Uvula midline. No uvula swelling.     Tonsils: No tonsillar exudate.      Comments: Extensive dental caries, no anterior neck swelling or tenderness.  Normal phonation. Eyes:      General: No scleral icterus.       Right eye: No discharge.        Left eye: No discharge.     Conjunctiva/sclera: Conjunctivae normal.  Pulmonary:     Effort: Pulmonary effort is normal.  Skin:    General: Skin is warm and dry.  Neurological:     General: No focal deficit present.     Mental Status: He is alert.  Psychiatric:        Mood and Affect: Mood normal.     ED Results / Procedures / Treatments   Labs (all labs ordered are listed, but only abnormal results are displayed) Labs Reviewed - No data to display  EKG None  Radiology No results found.  Procedures Procedures    Medications Ordered in ED Medications  amoxicillin-clavulanate (AUGMENTIN) 875-125 MG per tablet 1 tablet (has no administration in time range)  ibuprofen  (ADVIL ) tablet 600 mg (has no administration in time range)    ED Course/ Medical Decision Making/ A&P                                 Medical Decision Making 48 y/o male with dental pain and facial swelling.  Hypertensive on intake, vital signs  otherwise normal.  Cardiopulmonary dam is remarkable, oropharyngeal exam as above.  No sublingual or submental tenderness to palpation or anterior neck swelling to suggest Ludwig's angina.  Risk Prescription drug management.    Clinical patient presents with periapical infection.  Abscess is already draining, will not require I&D in the ED.  First of antibiotics administered in the emergency department and patient is discharged on oral antibiotics with outpatient dental resources and encouragement to follow-up.  Return precautions given.  [name]  voiced understanding of his medical evaluation and treatment plan. Each of their questions answered to their expressed satisfaction.  Return precautions were given.  Patient is well-appearing, stable, and was discharged in good condition.  This chart was dictated using voice recognition software, Dragon. Despite the best efforts of this provider to  proofread and correct errors, errors may still occur which can change documentation meaning.        Final Clinical Impression(s) / ED Diagnoses Final diagnoses:  None    Rx / DC Orders ED Discharge Orders     None         Arlyne Lame 08/19/23 0205    Eldon Greenland, MD 08/19/23 [phone]

## 2023-08-19 NOTE — Discharge Instructions (Addendum)
Please take entire course of antibiotics as directed.  Continue using ibuprofen / Tylenol, as well as prescribed Orajel for pain.  You will need to follow-up with your dentist for continued management of this. Please see dental resources below. Return to the emergency department for fevers, swelling or pain under the tongue or in the neck, difficulty breathing or swallowing, nausea or vomiting that does not stop, or any other new or concerning symptoms.

## 2023-10-23 ENCOUNTER — Encounter (HOSPITAL_BASED_OUTPATIENT_CLINIC_OR_DEPARTMENT_OTHER): Payer: Self-pay | Admitting: Student

## 2023-10-23 ENCOUNTER — Ambulatory Visit (INDEPENDENT_AMBULATORY_CARE_PROVIDER_SITE_OTHER): Admitting: Student

## 2023-10-23 ENCOUNTER — Ambulatory Visit (HOSPITAL_BASED_OUTPATIENT_CLINIC_OR_DEPARTMENT_OTHER)

## 2023-10-23 DIAGNOSIS — M456 Ankylosing spondylitis lumbar region: Secondary | ICD-10-CM

## 2023-10-23 DIAGNOSIS — M166 Other bilateral secondary osteoarthritis of hip: Secondary | ICD-10-CM

## 2023-10-23 DIAGNOSIS — M1612 Unilateral primary osteoarthritis, left hip: Secondary | ICD-10-CM | POA: Diagnosis not present

## 2023-10-23 DIAGNOSIS — M25552 Pain in left hip: Secondary | ICD-10-CM | POA: Diagnosis not present

## 2023-10-23 DIAGNOSIS — M545 Low back pain, unspecified: Secondary | ICD-10-CM

## 2023-10-23 DIAGNOSIS — G8929 Other chronic pain: Secondary | ICD-10-CM

## 2023-10-23 NOTE — Progress Notes (Signed)
 Chief Complaint: Left hip and low back pain     History of Present Illness:    Steven Jacobs is a 48 y.o. male who presents today for concerns of left hip and low back pain.  States that he had an injury when he was about 48 years old, when he fell directly onto his left hip on concrete after trying to dunk a basketball.  He has developed worsening low back and left hip pain over time, and reports having to begin using a cane in 2017.  Pain is located mainly in the left groin area and range of motion is significantly limited.  Denies any radiating leg pain, numbness, or tingling.  No medication or treatment at this time.   Surgical History:   None  PMH/PSH/Family History/Social History/Meds/Allergies:    Past Medical History:  Diagnosis Date   TB (pulmonary tuberculosis)    History reviewed. No pertinent surgical history. Social History   Socioeconomic History   Marital status: Single    Spouse name: Not on file   Number of children: Not on file   Years of education: Not on file   Highest education level: Not on file  Occupational History   Not on file  Tobacco Use   Smoking status: Every Day    Current packs/day: 1.00    Types: Cigarettes   Smokeless tobacco: Never  Substance and Sexual Activity   Alcohol use: Yes    Comment: occ   Drug use: Yes    Types: Marijuana   Sexual activity: Not on file  Other Topics Concern   Not on file  Social History Narrative   Not on file   Social Drivers of Health   Financial Resource Strain: Not on file  Food Insecurity: Not on file  Transportation Needs: Not on file  Physical Activity: Not on file  Stress: Not on file  Social Connections: Not on file   History reviewed. No pertinent family history. No Known Allergies Current Outpatient Medications  Medication Sig Dispense Refill   amoxicillin -clavulanate (AUGMENTIN ) 875-125 MG tablet Take 1 tablet by mouth every 12 (twelve) hours. 14  tablet 0   azithromycin  (ZITHROMAX ) 250 MG tablet Take 1 tablet (250 mg total) by mouth daily. Take first 2 tablets together, then 1 every day until finished. 6 tablet 0   benzonatate  (TESSALON ) 200 MG capsule Take 1 capsule (200 mg total) by mouth 3 (three) times daily as needed for cough. 20 capsule 0   predniSONE  (DELTASONE ) 20 MG tablet Take 2 tablets (40 mg total) by mouth daily with breakfast. 10 tablet 0   No current facility-administered medications for this visit.   No results found.  Review of Systems:   A ROS was performed including pertinent positives and negatives as documented in the HPI.  Physical Exam :   Constitutional: NAD and appears stated age Neurological: Alert and oriented Psych: Appropriate affect and cooperative There were no vitals taken for this visit.   Comprehensive Musculoskeletal Exam:    Extremely rigid hip joints bilaterally without any passive range of motion in flexion, internal rotation, or external rotation.  Patient ambulating with a cane with stifflegged gait bilaterally and forward stooping of the spine.  Imaging:   Xray (AP pelvis, left hip 3 views): Severe, end-stage bilateral hip arthritis with large osteophyte  formation.  Xray (lumbar spine 4 views): Bamboo like appearance of the lumbar spine with syndesmophytes and apparent fusing of the facet joints   I personally reviewed and interpreted the radiographs.   Assessment:   48 y.o. male with chronic history of left hip and low back pain.  X-rays taken today of the lumbar spine suggest ankylosing spondylitis which is also consistent with his exam.  This seems to unfortunately have affected both of his hip joints which show severe arthritis and are almost completely rigid both  and active and passive range of motion.  Given this I will plan to refer him to Dr. Vernetta to discuss total hip arthroplasty as I believe this could greatly improve his quality life.  Currently not demonstrating any  pain within the right hip, although this also has minimal motion and severe changes on x-ray.  Patient is agreeable to this plan.  Plan :    - Referral to Dr. Vernetta for THA discussion     I personally saw and evaluated the patient, and participated in the management and treatment plan.  Leonce Reveal, PA-C Orthopedics

## 2023-10-30 ENCOUNTER — Ambulatory Visit: Admitting: Orthopaedic Surgery

## 2023-10-30 ENCOUNTER — Other Ambulatory Visit: Payer: Self-pay

## 2023-10-30 DIAGNOSIS — M25552 Pain in left hip: Secondary | ICD-10-CM

## 2023-10-30 DIAGNOSIS — M160 Bilateral primary osteoarthritis of hip: Secondary | ICD-10-CM

## 2023-10-30 DIAGNOSIS — M456 Ankylosing spondylitis lumbar region: Secondary | ICD-10-CM | POA: Diagnosis not present

## 2023-10-30 DIAGNOSIS — M1611 Unilateral primary osteoarthritis, right hip: Secondary | ICD-10-CM

## 2023-10-30 DIAGNOSIS — M1612 Unilateral primary osteoarthritis, left hip: Secondary | ICD-10-CM

## 2023-10-30 NOTE — Progress Notes (Signed)
 Patient is a 48 year old gentleman sent to me from Leonce Reveal, PA-C to evaluate and treat severe arthritic changes in the patient's hips.  It looks like he has a significant history of ankylosing spondylitis.  The patient been dealing with a significant limp as well as neck stiffness and back stiffness for many years now.  He does ambulate using a cane and walks with a severe limp.  On exam when he stands up he cannot hold his neck and a straight position.  His neck is forward flexed.  He cannot stand up completely straight from his spine.  His hips essentially have almost no range of motion when I try to put them through range of motion and he cannot even sit correctly in a chair.  I could not get him to lay supine on the exam table and he has a hard time even laying flat.  X-rays in the canopy system of both hips and his lumbar spine show evidence of severe ankylosing spondylitis.  The patient is not aware of having this type of diagnosis but on imaging studies this is what it really looks like.  From my standpoint, I would not be comfortable with proceeding with replacement surgery given the fact that we could not have him comfortably be supine at all on a Hana table through an anterior approach.  I would like to send him to at least one of my colleagues in town who performs posterior hip surgery to see if the patient will be a candidate for a posterior hip replacement for each hip with time.  The patient agrees with this referral.

## 2023-11-05 DIAGNOSIS — M25552 Pain in left hip: Secondary | ICD-10-CM | POA: Diagnosis not present

## 2024-02-24 ENCOUNTER — Encounter: Payer: Self-pay | Admitting: Radiology
